# Patient Record
Sex: Female | Born: 1995 | Race: White | Hispanic: No | Marital: Married | Smoking: Never smoker
Health system: Southern US, Community
[De-identification: ages and names within clinical notes are randomized; demographics above are authoritative.]

## PROBLEM LIST (undated history)

## (undated) ENCOUNTER — Inpatient Hospital Stay (HOSPITAL_COMMUNITY): Payer: Self-pay

## (undated) DIAGNOSIS — T7840XA Allergy, unspecified, initial encounter: Secondary | ICD-10-CM

## (undated) DIAGNOSIS — R079 Chest pain, unspecified: Secondary | ICD-10-CM

## (undated) DIAGNOSIS — J40 Bronchitis, not specified as acute or chronic: Secondary | ICD-10-CM

## (undated) DIAGNOSIS — R519 Headache, unspecified: Secondary | ICD-10-CM

## (undated) DIAGNOSIS — L509 Urticaria, unspecified: Secondary | ICD-10-CM

## (undated) DIAGNOSIS — R002 Palpitations: Secondary | ICD-10-CM

## (undated) DIAGNOSIS — B999 Unspecified infectious disease: Secondary | ICD-10-CM

## (undated) DIAGNOSIS — T7806XA Anaphylactic reaction due to food additives, initial encounter: Secondary | ICD-10-CM

## (undated) DIAGNOSIS — N83209 Unspecified ovarian cyst, unspecified side: Secondary | ICD-10-CM

## (undated) DIAGNOSIS — R51 Headache: Secondary | ICD-10-CM

## (undated) DIAGNOSIS — D649 Anemia, unspecified: Secondary | ICD-10-CM

## (undated) HISTORY — PX: WISDOM TOOTH EXTRACTION: SHX21

## (undated) HISTORY — DX: Chest pain, unspecified: R07.9

## (undated) HISTORY — DX: Palpitations: R00.2

## (undated) HISTORY — DX: Urticaria, unspecified: L50.9

## (undated) HISTORY — PX: OTHER SURGICAL HISTORY: SHX169

---
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
## 2015-01-09 ENCOUNTER — Ambulatory Visit (INDEPENDENT_AMBULATORY_CARE_PROVIDER_SITE_OTHER): Payer: BLUE CROSS/BLUE SHIELD | Admitting: Emergency Medicine

## 2015-01-09 VITALS — BP 118/70 | HR 84 | Temp 97.5°F | Resp 18 | Ht 65.0 in | Wt 177.0 lb

## 2015-01-09 DIAGNOSIS — R002 Palpitations: Secondary | ICD-10-CM | POA: Diagnosis not present

## 2015-01-09 DIAGNOSIS — R21 Rash and other nonspecific skin eruption: Secondary | ICD-10-CM | POA: Diagnosis not present

## 2015-01-09 LAB — POCT CBC
GRANULOCYTE PERCENT: 70.8 % (ref 37–80)
HCT, POC: 41.6 % (ref 37.7–47.9)
HEMOGLOBIN: 13.5 g/dL (ref 12.2–16.2)
Lymph, poc: 2.2 (ref 0.6–3.4)
MCH: 27.6 pg (ref 27–31.2)
MCHC: 32.4 g/dL (ref 31.8–35.4)
MCV: 85.2 fL (ref 80–97)
MID (cbc): 0.2 (ref 0–0.9)
MPV: 7.8 fL (ref 0–99.8)
PLATELET COUNT, POC: 282 10*3/uL (ref 142–424)
POC Granulocyte: 5.7 (ref 2–6.9)
POC LYMPH PERCENT: 26.9 %L (ref 10–50)
POC MID %: 2.3 %M (ref 0–12)
RBC: 4.89 M/uL (ref 4.04–5.48)
RDW, POC: 13.6 %
WBC: 8 10*3/uL (ref 4.6–10.2)

## 2015-01-09 LAB — GLUCOSE, POCT (MANUAL RESULT ENTRY): POC GLUCOSE: 90 mg/dL (ref 70–99)

## 2015-01-09 MED ORDER — ALPRAZOLAM 0.25 MG PO TABS
ORAL_TABLET | ORAL | Status: DC
Start: 2015-01-09 — End: 2015-02-01

## 2015-01-09 MED ORDER — BETAMETHASONE DIPROPIONATE 0.05 % EX CREA: TOPICAL_CREAM | Freq: Two times a day (BID) | CUTANEOUS | Status: DC

## 2015-01-09 NOTE — Progress Notes (Signed)
Subjective:  This chart was scribed for  Jacqueline Chris MD,  by Veverly Fells, at [facility].  This patient was seen in room 5  and the patient's care was started at 1:23 PM.   Chief Complaint  Patient presents with  . Urticaria    back of both legs. x 3 days      Patient ID: [name], female    DOB: [birth year], 19 y.o.   MRN: [id number]  Urticaria Pertinent negatives include no cough or fever.    HPI Comments: [name] is a 19 y.o. female who presents to the [facility] complaining of bump like rash on the back of her legs onset four days ago.  She has allergies to bees, wasps and red 40 but states that she has not come in contact with any of these things recently.  She has not been shaving since her rash started.  She denies any recent illnesses.  She states that she has been waking up for the last month and a half with her heart pounding. When these episodes occur, she tries to take a deep breath and attempts to lower her heart rate but states that it is hard for her to do so.  She also had the fast pounding on her way over here.  She drinks caffeine but has never had it to the point where it has caused symptoms of chest pain for her.  She states that she has lost "a small amount" of weight recently. Patient states that there is no chance that she could be pregnant.  Patient is currently working and lives with her friends in [place].   Patient states that she is under a lot of stress with work, keeping up with rent and counseling that she is going through with her fiance (hopes to get married in the summer).  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     There are no active problems to display for this patient.  Past Medical History  Diagnosis Date  . Allergy   . 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 Bronchitis     Fall 2013,  prescribed inhaler, does not take inhaler now.   Past Surgical History  Procedure Laterality Date  . Other surgical history      splinter R foot req surgery, L foot glass required surgery, dates unknown  . Laparoscopic appendectomy N/A 05/27/2012    Procedure: APPENDECTOMY LAPAROSCOPIC;  Surgeon: Judie Petit. Leonia Corona, MD;  Location: [facility] OR;  Service: Pediatrics;  Laterality: N/A;   Allergies  Allergen Reactions  . Bee Venom Anaphylaxis  . Red Dye Anaphylaxis  . Wasp Venom Anaphylaxis  . Latex    Prior to Admission medications   Medication Sig Start Date End Date Taking? Authorizing Provider  EPINEPHrine (EPIPEN JR) 0.15 MG/0.3ML injection Inject 0.15 mg into the muscle daily as needed for anaphylaxis.   Yes Historical Provider, MD  EPINEPHrine (EPIPEN) 0.3 mg/0.3 mL DEVI tud 08/11/12  Yes Viviano Simas, NP  predniSONE (DELTASONE) 50 MG tablet 1 tab po qd x 4 more days Patient not taking: Reported on 01/09/2015 08/11/12   Viviano Simas, NP   Social History   Social History  . Marital Status: Single    Spouse Name: N/A  . Number of Children: N/A  . Years of Education: N/A   Occupational  History  . Not on file.   Social History Main Topics  . Smoking status: Never Smoker   . Smokeless tobacco: Not on file  . Alcohol Use: No  . Drug Use: No  . Sexual Activity: No   Other Topics Concern  . Not on file   Social History Narrative      Review of Systems  Constitutional: Negative for fever and chills.  Respiratory: Negative for cough and choking.   Cardiovascular: Positive for palpitations. Negative for chest pain.  Gastrointestinal: Negative for nausea.  Skin: Positive for rash.       Objective:   Physical Exam  Filed Vitals:   01/09/15 1223  BP: 118/70  Pulse: 84  Temp: 97.5 F (36.4 C)  TempSrc: Oral  Resp: 18  Height:  (1.651 m)  Weight: 177 lb (80.287 kg)  SpO2: 99%   CONSTITUTIONAL: Well developed/well nourished HEAD:  Normocephalic/atraumatic EYES: EOMI/PERRL ENMT: Mucous membranes moist NECK: supple no meningeal signs SPINE/BACK:entire spine nontender CV: S1/S2 noted, no murmurs/rubs/gallops noted LUNGS: Lungs are clear to auscultation bilaterally, no apparent distress ABDOMEN: soft, nontender, no rebound or guarding, bowel sounds noted throughout abdomen GU:no cva tenderness NEURO: Pt is awake/alert/appropriate, moves all extremitiesx4.  No facial droop.   EXTREMITIES: pulses normal/equal, full ROM SKIN: warm, color normal, she has multiple tiny red bumps on the back of both legs.   PSYCH: no abnormalities of mood noted, alert and oriented to situation EKG shows an unusual axis to the right otherwise no abnormalities.    Assessment & Plan:  The area on the back of her legs looks more like a heat rash. She will use a cortisone cream to this area. Advised her to put sheets over her cell phone and chairs. She also has a history of palpitations. She is under quite a bit of stress with her upcoming wedding as well as work stress and financial issues. Baseline EKG shows mild abnormalities. Referral made to cardiology for their opinion.I personally performed the services described in this documentation, which was scribed in my presence. The recorded information has been reviewed and is accurate.

## 2015-01-09 NOTE — Patient Instructions (Signed)
Heat Rash Heat rash (miliaria) is a skin irritation caused by heavy sweating during hot, humid weather. It results from blockage of the sweat glands on our body. It can occur at any age. It is most common in young children whose sweat ducts are still developing or are not fully developed. Tight clothing may make the condition worse. Heat rash can look like small blisters (vesicles) that break open easily with bathing or minimal pressure. These blisters are found most commonly on the face, upper trunk of children and the trunk of adults. It can also look like a red cluster of red bumps or pimples (pustules). These usually itch and can also sometimes burn. It is more likely to occur on the neck and upper chest, in the groin, under the breasts, and in elbow creases. HOME CARE INSTRUCTIONS   The best treatment for heat rash is to provide a cooler, less humid environment where sweating is much decreased.  Keep the affected area dry. Dusting powder (cornstarch powder, baby powder) may be used to increase comfort. Avoid using ointments or creams. They keep the skin warm and moist and may make the condition worse.  Treating heat rash is simple and usually does not require medical assistance. SEEK MEDICAL CARE IF:   There is any evidence of infection such as fever, redness, swelling.  There is discomfort such as pain.  The skin lesions do no resolve with cooler, dryer environment. MAKE SURE YOU:   Understand these instructions.  Will watch your condition.  Will get help right away if you are not doing well or get worse. Document Released: 03/25/2009 Document Revised: 06/29/2011 Document Reviewed: 03/25/2009 [facility] Patient Information 2015 [place], [place]. This information is not intended to replace advice given to you by your health care provider. Make sure you discuss any questions you have with your health care provider. Palpitations A palpitation is the feeling that your heartbeat is irregular  or is faster than normal. It may feel like your heart is fluttering or skipping a beat. Palpitations are usually not a serious problem. However, in some cases, you may need further medical evaluation. CAUSES  Palpitations can be caused by:  Smoking.  Caffeine or other stimulants, such as diet pills or energy drinks.  Alcohol.  Stress and anxiety.  Strenuous physical activity.  Fatigue.  Certain medicines.  Heart disease, especially if you have a history of irregular heart rhythms (arrhythmias), such as atrial fibrillation, atrial flutter, or supraventricular tachycardia.  An improperly working pacemaker or defibrillator. DIAGNOSIS  To find the cause of your palpitations, your health care provider will take your medical history and perform a physical exam. Your health care provider may also have you take a test called an ambulatory electrocardiogram (ECG). An ECG records your heartbeat patterns over a 24-hour period. You may also have other tests, such as:  Transthoracic echocardiogram (TTE). During echocardiography, sound waves are used to evaluate how blood flows through your heart.  Transesophageal echocardiogram (TEE).  Cardiac monitoring. This allows your health care provider to monitor your heart rate and rhythm in real time.  Holter monitor. This is a portable device that records your heartbeat and can help diagnose heart arrhythmias. It allows your health care provider to track your heart activity for several days, if needed.  Stress tests by exercise or by giving medicine that makes the heart beat faster. TREATMENT  Treatment of palpitations depends on the cause of your symptoms and can vary greatly. Most cases of palpitations do not require  any treatment other than time, relaxation, and monitoring your symptoms. Other causes, such as atrial fibrillation, atrial flutter, or supraventricular tachycardia, usually require further treatment. HOME CARE INSTRUCTIONS    Avoid:  Caffeinated coffee, tea, soft drinks, diet pills, and energy drinks.  Chocolate.  Alcohol.  Stop smoking if you smoke.  Reduce your stress and anxiety. Things that can help you relax include:  A method of controlling things in your body, such as your heartbeats, with your mind (biofeedback).  Yoga.  Meditation.  Physical activity such as swimming, jogging, or walking.  Get plenty of rest and sleep. SEEK MEDICAL CARE IF:   You continue to have a fast or irregular heartbeat beyond 24 hours.  Your palpitations occur more often. SEEK IMMEDIATE MEDICAL CARE IF:  You have chest pain or shortness of breath.  You have a severe headache.  You feel dizzy or you faint. MAKE SURE YOU:  Understand these instructions.  Will watch your condition.  Will get help right away if you are not doing well or get worse. Document Released: 04/03/2000 Document Revised: 04/11/2013 Document Reviewed: 06/05/2011 [facility] Patient Information 2015 [place], [place]. This information is not intended to replace advice given to you by your health care provider. Make sure you discuss any questions you have with your health care provider.

## 2015-01-09 NOTE — Progress Notes (Signed)
Subjective:  This chart was scribed for  [name],  by Veverly Fells, at [facility].  This patient was seen in room 5  and the patient's care was started at 1:23 PM.   Chief Complaint  Patient presents with   Urticaria    back of both legs. x 3 days      Patient ID: [name], female    DOB: [birth year], 19 y.o.   MRN: [id number]  HPI  HPI Comments: [name] is a 19 y.o. female who presents to the [facility] complaining of bump like rash on the back of her legs onset four days ago.  She has allergies to bees, wasps and red 40 but states that she has not come in contact with any of these things recently.  She has not been shaving since her rash started.  She denies any recent illnesses.  She states that she has been waking up for the last month and a half with her heart pounding. When these episodes occur, she tries to take a deep breath and attempts to lower her heart rate but states that it is hard for her to do so.  She also had the fast pounding on her way over here.  She drinks caffeine but has never had it to the point where it has caused symptoms of chest pain for her.  She states that she has lost "a small amount" of weight recently. Patient states that there is no chance that she could be pregnant.  Patient is currently working and lives with her friends in [place].   Patient states that she is under a lot of stress with work, keeping up with rent and counseling that she is going through with her fiance (hopes to get married in the summer).  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     There are no active problems to display for this patient.  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   Past  Surgical History  Procedure Laterality Date   Other surgical history      splinter R foot req surgery, L foot glass required surgery, dates unknown   Laparoscopic appendectomy N/A 05/27/2012    Procedure: APPENDECTOMY LAPAROSCOPIC;  Surgeon: Judie Petit. Leonia Corona, MD;  Location: [facility] OR;  Service: Pediatrics;  Laterality: N/A;   Allergies  Allergen Reactions   Bee Venom Anaphylaxis   Red Dye Anaphylaxis   Wasp Venom Anaphylaxis   Latex    Prior to Admission medications   Medication Sig Start Date End Date Taking? Authorizing Provider  EPINEPHrine (EPIPEN JR) 0.15 MG/0.3ML injection Inject 0.15 mg into the muscle daily as needed for anaphylaxis.   Yes Historical Provider, MD  EPINEPHrine (EPIPEN) 0.3 mg/0.3 mL DEVI tud 08/11/12  Yes Viviano Simas, NP  predniSONE (DELTASONE) 50 MG tablet 1 tab po qd x 4 more days Patient not taking: Reported on 01/09/2015 08/11/12   Viviano Simas, NP   Social History   Social History   Marital Status: Single    Spouse Name: N/A   Number of Children: N/A   Years of Education: N/A   Occupational History   Not on file.   Social  History Main Topics   Smoking status: Never Smoker    Smokeless tobacco: Not on file   Alcohol Use: No   Drug Use: No   Sexual Activity: No   Other Topics Concern   Not on file   Social History Narrative      Review of Systems  Constitutional: Negative for fever and chills.  Respiratory: Negative for cough and choking.   Cardiovascular: Positive for palpitations. Negative for chest pain.  Gastrointestinal: Negative for nausea.  Skin: Positive for rash.       Objective:   Physical Exam  Filed Vitals:   01/09/15 1223  BP: 118/70  Pulse: 84  Temp: 97.5 F (36.4 C)  TempSrc: Oral  Resp: 18  Height:  (1.651 m)  Weight: 177 lb (80.287 kg)  SpO2: 99%   CONSTITUTIONAL: Well developed/well nourished HEAD: Normocephalic/atraumatic EYES: EOMI/PERRL ENMT: Mucous membranes moist NECK:  supple no meningeal signs SPINE/BACK:entire spine nontender CV: S1/S2 noted, no murmurs/rubs/gallops noted LUNGS: Lungs are clear to auscultation bilaterally, no apparent distress ABDOMEN: soft, nontender, no rebound or guarding, bowel sounds noted throughout abdomen GU:no cva tenderness NEURO: Pt is awake/alert/appropriate, moves all extremitiesx4.  No facial droop.   EXTREMITIES: pulses normal/equal, full ROM SKIN: warm, color normal, she has multiple tiny red bumps on the back of both legs.   PSYCH: no abnormalities of mood noted, alert and oriented to situation EKG shows an unusual axis to the right otherwise no abnormalities.    Assessment & Plan:  The area on the back of her legs looks more like a heat rash. She will use a cortisone cream to this area. Advised her to put sheets over her cell phone and chairs. She also has a history of palpitations. She is under quite a bit of stress with her upcoming wedding as well as work stress and financial issues. Baseline EKG shows mild abnormalities. Referral made to cardiology for their opinion.I personally performed the services described in this documentation, which was scribed in my presence. The recorded information has been reviewed and is accurate.

## 2015-01-10 LAB — TSH: TSH: 0.918 u[IU]/mL (ref 0.350–4.500)

## 2015-01-31 NOTE — Progress Notes (Signed)
Cardiology Office Note   Date:  02/01/2015   ID:  [name], DOB 04/21/1995, MRN [id number]  PCP:  No PCP Per Patient  Cardiologist:   Madilyn Hook, MD   Chief Complaint  Patient presents with  . New Evaluation    pt states chest pain, Tuesday had sharp pain in chest that lasted 10 min//happens every once in a while, Tuesday was the worst.  . Shortness of Breath    when she gives tight hugs, she can't breath very well/stopped running because she couldn't breth and chest would start hurting  . Dizziness    when changing positions      History of Present Illness: [name] is a 19 y.o. female who presents for an evaluation of palpitations.  Ms. [name] reports a couple months of waking up with her heart racing.  The episodes last for several minutes and it is hard for her to fall back asleep.  Approximately one month ago she noticed chest pain while running.  It lasted for 15-20 minutes and occurred after she had been running for 2 miles. She felt short of breath and nauseous. The pain was 8 out of 10 in severity and did not radiate. Since then she has not been running because she is afraid this will happen again.  She has exerted herself minimally with walking and lifting boxes and has not had any recurrent chest pain since that time. She also felt as though her heart beating fast at the time but is unsure whether this was due to palpitations ordered because she had been running. Since then she continues to have daily episodes of heart racing that last for several minutes. It's hard for her to catch her breath when it happens. At times it wakes her from sleeping.    Ms. [name] saw her PCP on September 21 and reported these symptoms.  Dr. Lesle Chris ordered thyroid testing which was normal. He also checked a blood count which was negative for anemia. Her EKG was unremarkable.  He recommended that she follow up with cardiology to further evaluate these symptoms.  He also  recommended that she stop drinking caffeine, which has not helped.  Ms. [name] is unsure of her family history.  She thinks someone in her family has hypertension.  Her great grandfather had several heart attacks.  She thinks the first was at age 53.       Past Medical History  Diagnosis Date  . Allergy   . 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 Bronchitis     Fall 2013, prescribed inhaler, does not take inhaler now.  . Palpitations   . Chest pain     Past Surgical History  Procedure Laterality Date  . Other surgical history      splinter R foot req surgery, L foot glass required surgery, dates unknown  . Laparoscopic appendectomy N/A 05/27/2012    Procedure: APPENDECTOMY LAPAROSCOPIC;  Surgeon: Judie Petit. Leonia Corona, MD;  Location: [facility] OR;  Service: Pediatrics;  Laterality: N/A;     Current Outpatient Prescriptions  Medication Sig Dispense Refill  . EPINEPHrine (EPIPEN) 0.3 mg/0.3 mL DEVI tud 1 Device 2   No current facility-administered medications for this visit.    Allergies:   Bee venom; Red dye; Wasp venom; and Latex    Social History:  The patient  reports that she has never smoked. She does not have any smokeless  tobacco history on file. She reports that she does not drink alcohol or use illicit drugs.   Family History:  The patient's family history includes Alcohol abuse in her mother; Appendicitis in her brother; Cancer in her maternal aunt, paternal grandmother, and paternal uncle; Diabetes in her maternal grandfather, maternal grandmother, and mother; Drug abuse in her brother; Hypertension in her maternal grandfather and mother.    ROS:  Please see the history of present illness.   Otherwise, review of systems are positive for none.   All other systems are reviewed and negative.    PHYSICAL EXAM: VS:  BP   Pulse 63  Ht 5' 5.5" (1.664 m)  Wt 80.604 kg (177 lb 11.2 oz)  BMI 29.11 kg/m2  LMP 01/04/2015 , BMI Body mass  index is 29.11 kg/(m^2). GENERAL:  Well appearing HEENT:  Pupils equal round and reactive, fundi not visualized, oral mucosa unremarkable NECK:  No jugular venous distention, waveform within normal limits, carotid upstroke brisk and symmetric, no bruits, no thyromegaly LYMPHATICS:  No cervical adenopathy LUNGS:  Clear to auscultation bilaterally HEART:  RRR.  PMI not displaced or sustained,S1 and S2 within normal limits, no S3, no S4, no clicks, no rubs, no murmurs ABD:  Flat, positive bowel sounds normal in frequency in pitch, no bruits, no rebound, no guarding, no midline pulsatile mass, no hepatomegaly, no splenomegaly EXT:  2 plus pulses throughout, no edema, no cyanosis no clubbing SKIN:  No rashes no nodules NEURO:  Cranial nerves II through XII grossly intact, motor grossly intact throughout PSYCH:  Cognitively intact, oriented to person place and time    EKG:  EKG is ordered today. The ekg ordered today demonstrates sinus rhythm with R axis deviation.  Early R wave progression.  ?RVH.   Recent Labs: 01/09/2015: Hemoglobin 13.5; TSH 0.918    Lipid Panel No results found for: CHOL, TRIG, HDL, CHOLHDL, VLDL, LDLCALC, LDLDIRECT    Wt Readings from Last 3 Encounters:  02/01/15 80.604 kg (177 lb 11.2 oz)  01/09/15 80.287 kg (177 lb)  05/27/12 72.9 kg (160 lb 11.5 oz) (91 %*, Z = 1.32)   * Growth percentiles are based on CDC 2-20 Years data.      ASSESSMENT AND PLAN:  # Chest pain: Symptoms are atypical and she is quite young for obstructive coronary disease.  However, she is not exercising due to this pain.  Will obtain a treadmill stress test to rule out obstructive coronary disease.  We will also check a lipid panel to assess her baseline risk of coronary disease.  # Palpitations: ECG does not show any rhythm abnormalities.  We will obtain a 48 hour Holter, as she reports symptoms almost daily. She already had her thyroid and blood counts checked. We will check a basic  metabolic panel. We recommended that she continue to limit her caffeine intake.   Current medicines are reviewed at length with the patient today.  The patient does not have concerns regarding medicines.  The following changes have been made:  no change  Labs/ tests ordered today include:   Orders Placed This Encounter  Procedures  . Lipid panel  . Basic metabolic panel  . Holter monitor - 48 hour  . Exercise Tolerance Test  . EKG 12-Lead     Disposition:   FU with Sani Loiseau C. Duke Salvia, MD in 1 month.   Signed, Madilyn Hook, MD  02/01/2015 9:14 AM    [facility]

## 2015-02-01 ENCOUNTER — Ambulatory Visit (INDEPENDENT_AMBULATORY_CARE_PROVIDER_SITE_OTHER): Payer: BLUE CROSS/BLUE SHIELD | Admitting: Cardiovascular Disease

## 2015-02-01 ENCOUNTER — Encounter: Payer: Self-pay | Admitting: Cardiovascular Disease

## 2015-02-01 ENCOUNTER — Telehealth (HOSPITAL_COMMUNITY): Payer: Self-pay

## 2015-02-01 ENCOUNTER — Encounter (INDEPENDENT_AMBULATORY_CARE_PROVIDER_SITE_OTHER): Payer: BLUE CROSS/BLUE SHIELD

## 2015-02-01 VITALS — HR 63 | Ht 65.5 in | Wt 177.7 lb

## 2015-02-01 DIAGNOSIS — R079 Chest pain, unspecified: Secondary | ICD-10-CM | POA: Diagnosis not present

## 2015-02-01 DIAGNOSIS — R002 Palpitations: Secondary | ICD-10-CM

## 2015-02-01 DIAGNOSIS — Z13220 Encounter for screening for lipoid disorders: Secondary | ICD-10-CM | POA: Diagnosis not present

## 2015-02-01 LAB — LIPID PANEL
CHOL/HDL RATIO: 2.9 ratio (ref <=5.0)
CHOLESTEROL: 143 mg/dL (ref 125–170)
HDL: 50 mg/dL (ref >=46)
LDL Cholesterol: 80 mg/dL (ref <110)
TRIGLYCERIDES: 65 mg/dL (ref <150)
VLDL: 13 mg/dL (ref <30)

## 2015-02-01 LAB — BASIC METABOLIC PANEL
BUN: 13 mg/dL (ref 7–25)
CALCIUM: 9.4 mg/dL (ref 8.6–10.2)
CO2: 26 mmol/L (ref 20–31)
CREATININE: 0.63 mg/dL (ref 0.50–1.10)
Chloride: 105 mmol/L (ref 98–110)
GLUCOSE: 88 mg/dL (ref 65–99)
Potassium: 4.3 mmol/L (ref 3.5–5.3)
Sodium: 139 mmol/L (ref 135–146)

## 2015-02-01 NOTE — Telephone Encounter (Signed)
Encounter complete. 

## 2015-02-01 NOTE — Patient Instructions (Signed)
Labs- LIPID, BMP  Your physician has requested that you have an exercise tolerance test. For further information please visit [email or web address]. Please also follow instruction sheet, as given.  Your physician has recommended that you wear a holter monitor FOR 48 HOURS . Holter monitors are medical devices that record the heart's electrical activity. Doctors most often use these monitors to diagnose arrhythmias. Arrhythmias are problems with the speed or rhythm of the heartbeat. The monitor is a small, portable device. You can wear one while you do your normal daily activities. This is usually used to diagnose what is causing palpitations/syncope (passing out).  WILL CALL WITH RESULTS  Your physician recommends that you schedule a follow-up appointment in 1 MONTH WITH DR .

## 2015-02-05 ENCOUNTER — Encounter (HOSPITAL_COMMUNITY): Payer: Self-pay | Admitting: *Deleted

## 2015-02-05 ENCOUNTER — Ambulatory Visit (HOSPITAL_COMMUNITY)
Admission: RE | Admit: 2015-02-05 | Discharge: 2015-02-05 | Disposition: A | Discharge status: Home / Self Care | Payer: BLUE CROSS/BLUE SHIELD | Source: Ambulatory Visit | Attending: Cardiovascular Disease | Admitting: Cardiovascular Disease

## 2015-02-05 DIAGNOSIS — Z13220 Encounter for screening for lipoid disorders: Secondary | ICD-10-CM | POA: Insufficient documentation

## 2015-02-05 DIAGNOSIS — R072 Precordial pain: Secondary | ICD-10-CM | POA: Diagnosis not present

## 2015-02-05 DIAGNOSIS — R002 Palpitations: Secondary | ICD-10-CM | POA: Diagnosis not present

## 2015-02-05 DIAGNOSIS — R079 Chest pain, unspecified: Secondary | ICD-10-CM | POA: Insufficient documentation

## 2015-02-05 LAB — EXERCISE TOLERANCE TEST
CHL RATE OF PERCEIVED EXERTION: 17
CSEPED: 10 min
CSEPEDS: 38 s
CSEPEW: 12.7 METS
CSEPHR: 96 %
CSEPPHR: 193 {beats}/min
MPHR: 200 {beats}/min
Rest HR: 75 {beats}/min

## 2015-02-05 NOTE — Progress Notes (Unsigned)
Patient ID: Jacqueline CambridgeStephanie Carroll, female   DOB: 09-04-1995, 19 y.o.   MRN: [id number]  Patient experienced chest pain and dizziness. Test taken to Dr. Allyson SabalBerry and was okayed to D/C patient home when symptoms completely resolved.

## 2015-02-06 ENCOUNTER — Telehealth: Payer: Self-pay | Admitting: *Deleted

## 2015-02-06 NOTE — Telephone Encounter (Signed)
Spoke to patient.labs, stress test  Result given . Verbalized understanding

## 2015-02-06 NOTE — Telephone Encounter (Signed)
-----   Message from Chilton Siiffany Wentworth, MD sent at 02/05/2015  5:26 PM EDT ----- Normal lipids, electrolytes and kidney function.

## 2015-02-06 NOTE — Telephone Encounter (Signed)
-----   Message from Chilton Siiffany Pyatt, MD sent at 02/06/2015  2:02 PM EDT ----- Normal stress test.

## 2015-02-13 ENCOUNTER — Telehealth: Payer: Self-pay | Admitting: *Deleted

## 2015-02-13 NOTE — Telephone Encounter (Signed)
-----   Message from Chilton Siiffany Milo, MD sent at 02/12/2015  5:18 PM EDT ----- Normal study. We will review this at her follow up appointment.

## 2015-02-13 NOTE — Telephone Encounter (Signed)
Not able to leave message mailbox full

## 2015-02-15 ENCOUNTER — Telehealth: Payer: Self-pay | Admitting: Cardiovascular Disease

## 2015-02-15 NOTE — Telephone Encounter (Signed)
Mail box full- unable to leave message   result will be given at next appointment 03/04/15

## 2015-02-15 NOTE — Telephone Encounter (Signed)
Spoke to patient. Result given . Verbalized understanding  

## 2015-02-15 NOTE — Telephone Encounter (Signed)
Pt is returning your call

## 2015-03-03 NOTE — Progress Notes (Signed)
e  This encounter was created in error - please disregard.

## 2015-03-04 ENCOUNTER — Encounter: Payer: BLUE CROSS/BLUE SHIELD | Admitting: Cardiovascular Disease

## 2015-03-04 DIAGNOSIS — R0989 Other specified symptoms and signs involving the circulatory and respiratory systems: Secondary | ICD-10-CM

## 2015-12-05 ENCOUNTER — Emergency Department (HOSPITAL_COMMUNITY)
Admission: EM | Admit: 2015-12-05 | Discharge: 2015-12-05 | Disposition: A | Discharge status: Home / Self Care | Payer: BLUE CROSS/BLUE SHIELD | Attending: Emergency Medicine | Admitting: Emergency Medicine

## 2015-12-05 ENCOUNTER — Encounter (HOSPITAL_COMMUNITY): Payer: Self-pay | Admitting: Emergency Medicine

## 2015-12-05 ENCOUNTER — Emergency Department (HOSPITAL_COMMUNITY): Payer: BLUE CROSS/BLUE SHIELD

## 2015-12-05 DIAGNOSIS — N83201 Unspecified ovarian cyst, right side: Secondary | ICD-10-CM | POA: Insufficient documentation

## 2015-12-05 DIAGNOSIS — R1030 Lower abdominal pain, unspecified: Secondary | ICD-10-CM

## 2015-12-05 DIAGNOSIS — R1031 Right lower quadrant pain: Secondary | ICD-10-CM | POA: Diagnosis present

## 2015-12-05 DIAGNOSIS — Z91040 Latex allergy status: Secondary | ICD-10-CM | POA: Diagnosis not present

## 2015-12-05 DIAGNOSIS — Z79899 Other long term (current) drug therapy: Secondary | ICD-10-CM | POA: Insufficient documentation

## 2015-12-05 DIAGNOSIS — R102 Pelvic and perineal pain: Secondary | ICD-10-CM

## 2015-12-05 LAB — URINALYSIS, ROUTINE W REFLEX MICROSCOPIC
Bilirubin Urine: NEGATIVE
Glucose, UA: NEGATIVE mg/dL
Hgb urine dipstick: NEGATIVE
KETONES UR: NEGATIVE mg/dL
LEUKOCYTES UA: NEGATIVE
NITRITE: NEGATIVE
PROTEIN: NEGATIVE mg/dL
Specific Gravity, Urine: 1.01 (ref 1.005–1.030)
pH: 6.5 (ref 5.0–8.0)

## 2015-12-05 LAB — I-STAT BETA HCG BLOOD, ED (MC, WL, AP ONLY)

## 2015-12-05 LAB — CBC
HCT: 39.9 % (ref 36.0–46.0)
Hemoglobin: 14 g/dL (ref 12.0–15.0)
MCH: 29.7 pg (ref 26.0–34.0)
MCHC: 35.1 g/dL (ref 30.0–36.0)
MCV: 84.5 fL (ref 78.0–100.0)
PLATELETS: 217 10*3/uL (ref 150–400)
RBC: 4.72 MIL/uL (ref 3.87–5.11)
RDW: 12.5 % (ref 11.5–15.5)
WBC: 7.8 10*3/uL (ref 4.0–10.5)

## 2015-12-05 LAB — COMPREHENSIVE METABOLIC PANEL
ALT: 14 U/L (ref 14–54)
AST: 17 U/L (ref 15–41)
Albumin: 5 g/dL (ref 3.5–5.0)
Alkaline Phosphatase: 54 U/L (ref 38–126)
Anion gap: 8 (ref 5–15)
BILIRUBIN TOTAL: 0.5 mg/dL (ref 0.3–1.2)
BUN: 17 mg/dL (ref 6–20)
CHLORIDE: 108 mmol/L (ref 101–111)
CO2: 22 mmol/L (ref 22–32)
CREATININE: 0.59 mg/dL (ref 0.44–1.00)
Calcium: 9.6 mg/dL (ref 8.9–10.3)
Glucose, Bld: 91 mg/dL (ref 65–99)
POTASSIUM: 3.8 mmol/L (ref 3.5–5.1)
Sodium: 138 mmol/L (ref 135–145)
TOTAL PROTEIN: 7.5 g/dL (ref 6.5–8.1)

## 2015-12-05 LAB — LIPASE, BLOOD: LIPASE: 22 U/L (ref 11–51)

## 2015-12-05 MED ORDER — ONDANSETRON HCL 4 MG/2ML IJ SOLN
4.0000 mg | Freq: Once | INTRAMUSCULAR | Status: AC | PRN
  Administered 2015-12-05: 4 mg via INTRAVENOUS
  Filled 2015-12-05: qty 2

## 2015-12-05 MED ORDER — FENTANYL CITRATE (PF) 100 MCG/2ML IJ SOLN
50.0000 ug | INTRAMUSCULAR | Status: DC | PRN
  Administered 2015-12-05: 50 ug via INTRAVENOUS
  Filled 2015-12-05: qty 2

## 2015-12-05 MED ORDER — IBUPROFEN 800 MG PO TABS
800.0000 mg | ORAL_TABLET | Freq: Three times a day (TID) | ORAL | 0 refills | Status: DC | PRN
Start: 2015-12-05 — End: 2016-02-04

## 2015-12-05 NOTE — Discharge Instructions (Signed)
Read the information below.  Use the prescribed medication as directed.  Please discuss all new medications with your pharmacist.  You may return to the Emergency Department at any time for worsening condition or any new symptoms that concern you.   If you develop high fevers, worsening abdominal pain, uncontrolled vomiting, or are unable to tolerate fluids by mouth, return to the ER for a recheck.  ° °

## 2015-12-05 NOTE — ED Triage Notes (Signed)
Pt states she woke up about 45 minutes with severe lower abd pain  Pt states the pain is in the middle of her lower abdomen area and a little to the right side  Pt states she thought it was menstrual cramps but these are much worse  Pt has nausea without vomiting

## 2015-12-05 NOTE — ED Provider Notes (Signed)
[facility]-EMERGENCY DEPT Provider Note   CSN: [id number] Arrival date & time: 12/05/15  0306     History   Chief Complaint Chief Complaint  Patient presents with  . Abdominal Pain    HPI [name] is a 20 y.o. female.  HPI   Patient presents with sharp lower abdominal pain that woke her from sleep.  Associated nausea.  Described as constant but increases in waves, located in suprapubic area and to the right, compared to more severe menstrual cramps.  LMP was two weeks ago and she ovulated 3 days ago.  Has urinary frequency for some time.  Denies fever, vomiting, dysuria, change in bowel habits.  Denies any abnormal vaginal discharge or bleeding.  Has had increased anxiety recently and notes a hx PTSD.  She is monogamous with new husband of 3 months.  s/p appendectomy, no other abdominal surgeries.  Never had known ovarian cyst.    Past Medical History:  Diagnosis Date  . Allergy   . 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 Bronchitis    Fall 2013, prescribed inhaler, does not take inhaler now.  . Chest pain   . Palpitations     Patient Active Problem List   Diagnosis Date Noted  . Palpitations   . Chest pain     Past Surgical History:  Procedure Laterality Date  . LAPAROSCOPIC APPENDECTOMY N/A 05/27/2012   Procedure: APPENDECTOMY LAPAROSCOPIC;  Surgeon: Judie PetitM. Leonia CoronaShuaib Farooqui, MD;  Location: [facility] OR;  Service: Pediatrics;  Laterality: N/A;  . OTHER SURGICAL HISTORY     splinter R foot req surgery, L foot glass required surgery, dates unknown    OB History    No data available       Home Medications    Prior to Admission medications   Medication Sig Start Date End Date Taking? Authorizing Provider  EPINEPHrine (EPIPEN) 0.3 mg/0.3 mL DEVI tud 08/11/12  Yes Viviano SimasLauren Robinson, NP  Multiple Vitamin (MULTIVITAMIN WITH MINERALS) TABS tablet Take 1 tablet by mouth daily.   Yes Historical Provider, MD  ibuprofen (ADVIL,MOTRIN) 800 MG  tablet Take 1 tablet (800 mg total) by mouth every 8 (eight) hours as needed for mild pain or moderate pain. 12/05/15   Trixie DredgeEmily Precious Gilchrest, PA-C    Family History Family History  Problem Relation Age of Onset  . Diabetes Mother   . Hypertension Mother   . Alcohol abuse Mother   . Appendicitis Brother   . Drug abuse Brother   . Cancer Maternal Aunt   . Cancer Paternal Uncle   . Diabetes Maternal Grandmother   . Diabetes Maternal Grandfather   . Hypertension Maternal Grandfather   . Cancer Paternal Grandmother     liver cancer    Social History Social History  Substance Use Topics  . Smoking status: Never Smoker  . Smokeless tobacco: Never Used  . Alcohol use No     Allergies   Bee venom; Red dye; Wasp venom; and Latex   Review of Systems Review of Systems  All other systems reviewed and are negative.    Physical Exam Updated Vital Signs BP (!) 93/54 (BP Location: Right Arm)   Pulse 61   Temp 98.1 F (36.7 C) (Oral)   Resp 18   Ht 5' 5.5" (1.664 m)   LMP 11/17/2015 (Approximate)   SpO2 99%   Physical Exam  Constitutional: She appears well-developed and well-nourished. No distress.  HENT:  Head: Normocephalic and  atraumatic.  Neck: Neck supple.  Cardiovascular: Normal rate and regular rhythm.   Pulmonary/Chest: Effort normal and breath sounds normal. No respiratory distress. She has no wheezes. She has no rales.  Abdominal: Soft. Bowel sounds are normal. She exhibits no distension. There is tenderness. There is no rebound, no guarding and no CVA tenderness.  Diffuse tenderness, worst in suprapubic region.    Neurological: She is alert.  Skin: She is not diaphoretic.  Nursing note and vitals reviewed.    ED Treatments / Results  Labs (all labs ordered are listed, but only abnormal results are displayed) Labs Reviewed  LIPASE, BLOOD  COMPREHENSIVE METABOLIC PANEL  CBC  URINALYSIS, ROUTINE W REFLEX MICROSCOPIC (NOT AT [facility])  I-STAT BETA HCG BLOOD, ED ([facility], [facility],  [facility] ONLY)    EKG  EKG Interpretation None       Radiology US Transvaginal Non-ob  Result Date: 12/05/2015 CLINICAL DATA:  Pelvic pain EXAM: TRANSABDOMINAL AND TRANSVAGINAL ULTRASOUND OF PELVIS DOPPLER ULTRASOUND OF OVARIES TECHNIQUE: Both transabdominal and transvaginal ultrasound examinations of the pelvis were performed. Transabdominal technique was performed for global imaging of the pelvis including uterus, ovaries, adnexal regions, and pelvic cul-de-sac. It was necessary to proceed with endovaginal exam following the transabdominal exam to visualize the uterus and ovaries. Color and duplex Doppler ultrasound was utilized to evaluate blood flow to the ovaries. COMPARISON:  Pelvic ultrasound 05/27/2012 FINDINGS: Uterus Measurements: 7.2 x 3.1 x 4.6 cm. No fibroids or other mass visualized. Endometrium Thickness: 12.4 mm.  No focal abnormality visualized. Right ovary Measurements: 3.8 x 2.7 x 3.5 cm. Multiple follicles. Hypoechoic region in the right ovary likely a complex cyst measuring 3 cm. Normal appearance/no adnexal mass. Left ovary Measurements: 3.8 x 1.3 x 3.4 cm. Normal appearance/no adnexal mass. Pulsed Doppler evaluation of both ovaries demonstrates normal low-resistance arterial and venous waveforms. Other findings Small amount of free fluid. IMPRESSION: No acute abnormality in the pelvis. Electronically Signed   By: Marlan Palau M.D.   On: 12/05/2015 07:56   US Pelvis Complete  Result Date: 12/05/2015 CLINICAL DATA:  Pelvic pain EXAM: TRANSABDOMINAL AND TRANSVAGINAL ULTRASOUND OF PELVIS DOPPLER ULTRASOUND OF OVARIES TECHNIQUE: Both transabdominal and transvaginal ultrasound examinations of the pelvis were performed. Transabdominal technique was performed for global imaging of the pelvis including uterus, ovaries, adnexal regions, and pelvic cul-de-sac. It was necessary to proceed with endovaginal exam following the transabdominal exam to visualize the uterus and ovaries. Color and  duplex Doppler ultrasound was utilized to evaluate blood flow to the ovaries. COMPARISON:  Pelvic ultrasound 05/27/2012 FINDINGS: Uterus Measurements: 7.2 x 3.1 x 4.6 cm. No fibroids or other mass visualized. Endometrium Thickness: 12.4 mm.  No focal abnormality visualized. Right ovary Measurements: 3.8 x 2.7 x 3.5 cm. Multiple follicles. Hypoechoic region in the right ovary likely a complex cyst measuring 3 cm. Normal appearance/no adnexal mass. Left ovary Measurements: 3.8 x 1.3 x 3.4 cm. Normal appearance/no adnexal mass. Pulsed Doppler evaluation of both ovaries demonstrates normal low-resistance arterial and venous waveforms. Other findings Small amount of free fluid. IMPRESSION: No acute abnormality in the pelvis. Electronically Signed   By: Marlan Palau M.D.   On: 12/05/2015 07:56   [place] Art/ven Flow Abd Pelv Doppler  Result Date: 12/05/2015 CLINICAL DATA:  Pelvic pain EXAM: TRANSABDOMINAL AND TRANSVAGINAL ULTRASOUND OF PELVIS DOPPLER ULTRASOUND OF OVARIES TECHNIQUE: Both transabdominal and transvaginal ultrasound examinations of the pelvis were performed. Transabdominal technique was performed for global imaging of the pelvis including uterus, ovaries, adnexal regions, and pelvic  cul-de-sac. It was necessary to proceed with endovaginal exam following the transabdominal exam to visualize the uterus and ovaries. Color and duplex Doppler ultrasound was utilized to evaluate blood flow to the ovaries. COMPARISON:  Pelvic ultrasound 05/27/2012 FINDINGS: Uterus Measurements: 7.2 x 3.1 x 4.6 cm. No fibroids or other mass visualized. Endometrium Thickness: 12.4 mm.  No focal abnormality visualized. Right ovary Measurements: 3.8 x 2.7 x 3.5 cm. Multiple follicles. Hypoechoic region in the right ovary likely a complex cyst measuring 3 cm. Normal appearance/no adnexal mass. Left ovary Measurements: 3.8 x 1.3 x 3.4 cm. Normal appearance/no adnexal mass. Pulsed Doppler evaluation of both ovaries demonstrates normal  low-resistance arterial and venous waveforms. Other findings Small amount of free fluid. IMPRESSION: No acute abnormality in the pelvis. Electronically Signed   By: Marlan Palauharles  Clark M.D.   On: 12/05/2015 07:56    Procedures Procedures (including critical care time)  Medications Ordered in ED Medications  fentaNYL (SUBLIMAZE) injection 50 mcg (50 mcg Intravenous Given 12/05/15 0633)  ondansetron (ZOFRAN) injection 4 mg (4 mg Intravenous Given 12/05/15 0630)     Initial Impression / Assessment and Plan / ED Course  I have reviewed the triage vital signs and the nursing notes.  Pertinent labs & imaging results that were available during my care of the patient were reviewed by me and considered in my medical decision making (see chart for details).  Clinical Course    Afebrile, nontoxic patient with pelvic pain that woke her from sleep.  Labs, UA unremarkable.  Pelvic US demonstrates multiple follicles and 3cm cyst in right ovary without torsion.    D/C home with motrin, gyn follow up.  Discussed result, findings, treatment, and follow up  with patient.  Pt given return precautions.  Pt verbalizes understanding and agrees with plan.        Final Clinical Impressions(s) / ED Diagnoses   Final diagnoses:  Pelvic pain in female  Lower abdominal pain  Cyst of right ovary    New Prescriptions Discharge Medication List as of 12/05/2015  8:42 AM    START taking these medications   Details  ibuprofen (ADVIL,MOTRIN) 800 MG tablet Take 1 tablet (800 mg total) by mouth every 8 (eight) hours as needed for mild pain or moderate pain., Starting Thu 12/05/2015, Print         GillhamEmily Maelani Yarbro, PA-C 12/05/15 1003    Paula LibraJohn Molpus, MD 12/05/15 2252

## 2015-12-06 LAB — URINE CULTURE

## 2015-12-11 ENCOUNTER — Inpatient Hospital Stay (HOSPITAL_COMMUNITY)
Admission: AD | Admit: 2015-12-11 | Discharge: 2015-12-11 | Disposition: A | Discharge status: Home / Self Care | Payer: BLUE CROSS/BLUE SHIELD | Source: Ambulatory Visit | Attending: Family Medicine | Admitting: Family Medicine

## 2015-12-11 ENCOUNTER — Inpatient Hospital Stay (HOSPITAL_COMMUNITY): Payer: BLUE CROSS/BLUE SHIELD

## 2015-12-11 DIAGNOSIS — R109 Unspecified abdominal pain: Secondary | ICD-10-CM

## 2015-12-11 DIAGNOSIS — Z79899 Other long term (current) drug therapy: Secondary | ICD-10-CM | POA: Diagnosis not present

## 2015-12-11 DIAGNOSIS — N83201 Unspecified ovarian cyst, right side: Secondary | ICD-10-CM

## 2015-12-11 LAB — URINALYSIS, ROUTINE W REFLEX MICROSCOPIC
BILIRUBIN URINE: NEGATIVE
Glucose, UA: NEGATIVE mg/dL
Hgb urine dipstick: NEGATIVE
KETONES UR: NEGATIVE mg/dL
LEUKOCYTES UA: NEGATIVE
NITRITE: NEGATIVE
PROTEIN: NEGATIVE mg/dL
Specific Gravity, Urine: 1.01 (ref 1.005–1.030)
pH: 6.5 (ref 5.0–8.0)

## 2015-12-11 LAB — CBC WITH DIFFERENTIAL/PLATELET
BASOS ABS: 0 10*3/uL (ref 0.0–0.1)
BASOS PCT: 0 %
EOS ABS: 0.2 10*3/uL (ref 0.0–0.7)
Eosinophils Relative: 2 %
HCT: 36.3 % (ref 36.0–46.0)
HEMOGLOBIN: 13 g/dL (ref 12.0–15.0)
Lymphocytes Relative: 39 %
Lymphs Abs: 3.5 10*3/uL (ref 0.7–4.0)
MCH: 29.5 pg (ref 26.0–34.0)
MCHC: 35.8 g/dL (ref 30.0–36.0)
MCV: 82.5 fL (ref 78.0–100.0)
Monocytes Absolute: 0.6 10*3/uL (ref 0.1–1.0)
Monocytes Relative: 7 %
NEUTROS ABS: 4.7 10*3/uL (ref 1.7–7.7)
NEUTROS PCT: 52 %
PLATELETS: 220 10*3/uL (ref 150–400)
RBC: 4.4 MIL/uL (ref 3.87–5.11)
RDW: 12.8 % (ref 11.5–15.5)
WBC: 9 10*3/uL (ref 4.0–10.5)

## 2015-12-11 MED ORDER — KETOROLAC TROMETHAMINE 60 MG/2ML IM SOLN
60.0000 mg | Freq: Once | INTRAMUSCULAR | Status: AC
  Administered 2015-12-11: 60 mg via INTRAMUSCULAR
  Filled 2015-12-11: qty 2

## 2015-12-11 MED ORDER — EPINEPHRINE 0.3 MG/0.3ML IJ DEVI: INTRAMUSCULAR | 3 refills | Status: DC

## 2015-12-11 MED ORDER — OXYCODONE-ACETAMINOPHEN 5-325 MG PO TABS
1.0000 | ORAL_TABLET | Freq: Once | ORAL | Status: AC
  Administered 2015-12-11: 1 via ORAL
  Filled 2015-12-11: qty 1

## 2015-12-11 MED ORDER — OXYCODONE-ACETAMINOPHEN 5-325 MG PO TABS: 1.0000 | ORAL_TABLET | ORAL | 0 refills | Status: DC | PRN

## 2015-12-11 NOTE — MAU Provider Note (Signed)
History     CSN: [id number]  Arrival date and time: 12/11/15 0149   None     No chief complaint on file.  HPI   Ms.[name] is a 20 y.o. female with a known ovarian cyst here tonight with abdominal pain.  [facility] and diagnosed with a hemorrhagic cyst. She was given 800 mg Ibuprofen tabs and instructed to return here if symptoms worsened.  The pain subsided throughout the week and the ibuprofen helped. She took the ibuprofen last night and it did not help. She is here curled up in a ball on the bed due to the pain. She is here with her significant other.   The pain is located in the center of her abdomen, the pain is constant. The pain is worse than it was the last time she was seen.  She denies vomiting or fever.  OB History    No data available      Past Medical History:  Diagnosis Date  . Allergy   . 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 Bronchitis    Fall 2013, prescribed inhaler, does not take inhaler now.  . Chest pain   . Palpitations     Past Surgical History:  Procedure Laterality Date  . LAPAROSCOPIC APPENDECTOMY N/A 05/27/2012   Procedure: APPENDECTOMY LAPAROSCOPIC;  Surgeon: Judie PetitM. Leonia CoronaShuaib Farooqui, MD;  Location: [facility] OR;  Service: Pediatrics;  Laterality: N/A;  . OTHER SURGICAL HISTORY     splinter R foot req surgery, L foot glass required surgery, dates unknown    Family History  Problem Relation Age of Onset  . Diabetes Mother   . Hypertension Mother   . Alcohol abuse Mother   . Appendicitis Brother   . Drug abuse Brother   . Cancer Maternal Aunt   . Cancer Paternal Uncle   . Diabetes Maternal Grandmother   . Diabetes Maternal Grandfather   . Hypertension Maternal Grandfather   . Cancer Paternal Grandmother     liver cancer    Social History  Substance Use Topics  . Smoking status: Never Smoker  . Smokeless tobacco: Never Used  . Alcohol use No    Allergies:   Allergies  Allergen Reactions  . Bee Venom Anaphylaxis  . Red Dye Anaphylaxis  . Wasp Venom Anaphylaxis  . Latex     Prescriptions Prior to Admission  Medication Sig Dispense Refill Last Dose  . EPINEPHrine (EPIPEN) 0.3 mg/0.3 mL DEVI tud 1 Device 2 unknown at unknown  . ibuprofen (ADVIL,MOTRIN) 800 MG tablet Take 1 tablet (800 mg total) by mouth every 8 (eight) hours as needed for mild pain or moderate pain. 15 tablet 0   . Multiple Vitamin (MULTIVITAMIN WITH MINERALS) TABS tablet Take 1 tablet by mouth daily.   12/04/2015 at Unknown time   Results for orders placed or performed during the hospital encounter of 12/11/15 (from the past 48 hour(s))  CBC with Differential     Status: None   Collection Time: 12/11/15  2:20 AM  Result Value Ref Range   WBC 9.0 4.0 - 10.5 K/uL   RBC 4.40 3.87 - 5.11 MIL/uL   Hemoglobin 13.0 12.0 - 15.0 g/dL   HCT 40.936.3 81.136.0 - 91.446.0 %   MCV 82.5 78.0 - 100.0 fL   MCH 29.5 26.0 - 34.0 pg   MCHC 35.8 30.0 - 36.0 g/dL   RDW 78.212.8 95.611.5 -  15.5 %   Platelets 220 150 - 400 K/uL   Neutrophils Relative % 52 %   Neutro Abs 4.7 1.7 - 7.7 K/uL   Lymphocytes Relative 39 %   Lymphs Abs 3.5 0.7 - 4.0 K/uL   Monocytes Relative 7 %   Monocytes Absolute 0.6 0.1 - 1.0 K/uL   Eosinophils Relative 2 %   Eosinophils Absolute 0.2 0.0 - 0.7 K/uL   Basophils Relative 0 %   Basophils Absolute 0.0 0.0 - 0.1 K/uL  Urinalysis, Routine w reflex microscopic (not at [facility])     Status: None   Collection Time: 12/11/15  2:35 AM  Result Value Ref Range   Color, Urine YELLOW YELLOW   APPearance CLEAR CLEAR   Specific Gravity, Urine 1.010 1.005 - 1.030   pH 6.5 5.0 - 8.0   Glucose, UA NEGATIVE NEGATIVE mg/dL   Hgb urine dipstick NEGATIVE NEGATIVE   Bilirubin Urine NEGATIVE NEGATIVE   Ketones, ur NEGATIVE NEGATIVE mg/dL   Protein, ur NEGATIVE NEGATIVE mg/dL   Nitrite NEGATIVE NEGATIVE   Leukocytes, UA NEGATIVE NEGATIVE    Comment: MICROSCOPIC NOT DONE ON URINES WITH NEGATIVE  PROTEIN, BLOOD, LEUKOCYTES, NITRITE, OR GLUCOSE <1000 mg/dL.   [place] Transvaginal Non-ob  Result Date: 12/11/2015 CLINICAL DATA:  Abdominal pain , right lower quadrant for 5 days EXAM: TRANSVAGINAL ULTRASOUND OF PELVIS DOPPLER ULTRASOUND OF OVARIES TECHNIQUE: Transvaginal ultrasound examination of the pelvis was performed including evaluation of the uterus, ovaries, adnexal regions, and pelvic cul-de-sac. Color and duplex Doppler ultrasound was utilized to evaluate blood flow to the ovaries. COMPARISON:  12/05/2015 FINDINGS: Uterus Measurements: 7 x 4 x 5 cm. No fibroids or other mass visualized. Endometrium Thickness: 8 mm.  No focal abnormality visualized. Right ovary Measurements: 36 x 34 x 30 mm. Crenulated area with peripheral hypervascularity compatible with a collapsed cyst/corpus luteum, decreased in size from prior. Left ovary Measurements: 38 x 15 x 23 mm. Normal appearance/no adnexal mass. Pulsed Doppler evaluation demonstrates normal low-resistance arterial and venous waveforms in both ovaries. Small simple pelvic fluid that has increased from prior. IMPRESSION: 1. Negative for ovarian torsion. 2. Small volume, simple pelvic fluid is mildly increased from 12/05/2015. Electronically Signed   By: Marnee SpringJonathon  Watts M.D.   On: 12/11/2015 03:15   [place] Art/ven Flow Abd Pelv Doppler  Result Date: 12/11/2015 CLINICAL DATA:  Abdominal pain , right lower quadrant for 5 days EXAM: TRANSVAGINAL ULTRASOUND OF PELVIS DOPPLER ULTRASOUND OF OVARIES TECHNIQUE: Transvaginal ultrasound examination of the pelvis was performed including evaluation of the uterus, ovaries, adnexal regions, and pelvic cul-de-sac. Color and duplex Doppler ultrasound was utilized to evaluate blood flow to the ovaries. COMPARISON:  12/05/2015 FINDINGS: Uterus Measurements: 7 x 4 x 5 cm. No fibroids or other mass visualized. Endometrium Thickness: 8 mm.  No focal abnormality visualized. Right ovary Measurements: 36 x 34 x 30 mm. Crenulated area  with peripheral hypervascularity compatible with a collapsed cyst/corpus luteum, decreased in size from prior. Left ovary Measurements: 38 x 15 x 23 mm. Normal appearance/no adnexal mass. Pulsed Doppler evaluation demonstrates normal low-resistance arterial and venous waveforms in both ovaries. Small simple pelvic fluid that has increased from prior. IMPRESSION: 1. Negative for ovarian torsion. 2. Small volume, simple pelvic fluid is mildly increased from 12/05/2015. Electronically Signed   By: Marnee SpringJonathon  Watts M.D.   On: 12/11/2015 03:15   Review of Systems  Constitutional: Negative for chills and fever.  Gastrointestinal: Positive for abdominal pain and nausea. Negative for vomiting.   Physical  Exam   Blood pressure 105/62, pulse 66, temperature 98.3 F (36.8 C), temperature source Oral, resp. rate 16, last menstrual period 11/17/2015, SpO2 100 %.  Physical Exam  Constitutional: She is oriented to person, place, and time. She appears well-developed and well-nourished. She appears distressed.  Neck: Neck supple.  GI: Soft. There is tenderness in the right lower quadrant, suprapubic area and left lower quadrant. There is rigidity. There is no rebound and no guarding.  Musculoskeletal: Normal range of motion.  Neurological: She is alert and oriented to person, place, and time.  Skin: Skin is warm. She is not diaphoretic.  Psychiatric: Her behavior is normal.    MAU Course  Procedures  None  MDM  Toradol 60 mg IM Percocet 1 tab given PO [place] to evaluate for ovarian torsion.  Patient rates her pain 0/10 after medication. [place] without sign of torsion. Some improvement since previous U/S.   Assessment and Plan   A:  1. Abdominal pain   2. Right ovarian cyst     P:  Discharge home in stable condition Rx: Percocet Ok to continue ibuprofen Return precautions discussed Discussed follow up: [facility]  Information given.   Duane Lope, NP 12/14/2015 8:20 PM

## 2015-12-11 NOTE — Discharge Instructions (Signed)
Ovarian Cyst An ovarian cyst is a fluid-filled sac that forms on an ovary. The ovaries are small organs that produce eggs in women. Various types of cysts can form on the ovaries. Most are not cancerous. Many do not cause problems, and they often go away on their own. Some may cause symptoms and require treatment. Common types of ovarian cysts include:  Functional cysts--These cysts may occur every month during the menstrual cycle. This is normal. The cysts usually go away with the next menstrual cycle if the woman does not get pregnant. Usually, there are no symptoms with a functional cyst.  Endometrioma cysts--These cysts form from the tissue that lines the uterus. They are also called "chocolate cysts" because they become filled with blood that turns brown. This type of cyst can cause pain in the lower abdomen during intercourse and with your menstrual period.  Cystadenoma cysts--This type develops from the cells on the outside of the ovary. These cysts can get very big and cause lower abdomen pain and pain with intercourse. This type of cyst can twist on itself, cut off its blood supply, and cause severe pain. It can also easily rupture and cause a lot of pain.  Dermoid cysts--This type of cyst is sometimes found in both ovaries. These cysts may contain different kinds of body tissue, such as skin, teeth, hair, or cartilage. They usually do not cause symptoms unless they get very big.  Theca lutein cysts--These cysts occur when too much of a certain hormone (human chorionic gonadotropin) is produced and overstimulates the ovaries to produce an egg. This is most common after procedures used to assist with the conception of a baby (in vitro fertilization). CAUSES   Fertility drugs can cause a condition in which multiple large cysts are formed on the ovaries. This is called ovarian hyperstimulation syndrome.  A condition called polycystic ovary syndrome can cause hormonal imbalances that can lead to  nonfunctional ovarian cysts. SIGNS AND SYMPTOMS  Many ovarian cysts do not cause symptoms. If symptoms are present, they may include:  Pelvic pain or pressure.  Pain in the lower abdomen.  Pain during sexual intercourse.  Increasing girth (swelling) of the abdomen.  Abnormal menstrual periods.  Increasing pain with menstrual periods.  Stopping having menstrual periods without being pregnant. DIAGNOSIS  These cysts are commonly found during a routine or annual pelvic exam. Tests may be ordered to find out more about the cyst. These tests may include:  Ultrasound.  X-ray of the pelvis.  CT scan.  MRI.  Blood tests. TREATMENT  Many ovarian cysts go away on their own without treatment. Your health care provider may want to check your cyst regularly for 2-3 months to see if it changes. For women in menopause, it is particularly important to monitor a cyst closely because of the higher rate of ovarian cancer in menopausal women. When treatment is needed, it may include any of the following:  A procedure to drain the cyst (aspiration). This may be done using a long needle and ultrasound. It can also be done through a laparoscopic procedure. This involves using a thin, lighted tube with a tiny camera on the end (laparoscope) inserted through a small incision.  Surgery to remove the whole cyst. This may be done using laparoscopic surgery or an open surgery involving a larger incision in the lower abdomen.  Hormone treatment or birth control pills. These methods are sometimes used to help dissolve a cyst. HOME CARE INSTRUCTIONS   Only take over-the-counter   or prescription medicines as directed by your health care provider.  Follow up with your health care provider as directed.  Get regular pelvic exams and Pap tests. SEEK MEDICAL CARE IF:   Your periods are late, irregular, or painful, or they stop.  Your pelvic pain or abdominal pain does not go away.  Your abdomen becomes  larger or swollen.  You have pressure on your bladder or trouble emptying your bladder completely.  You have pain during sexual intercourse.  You have feelings of fullness, pressure, or discomfort in your stomach.  You lose weight for no apparent reason.  You feel generally ill.  You become constipated.  You lose your appetite.  You develop acne.  You have an increase in body and facial hair.  You are gaining weight, without changing your exercise and eating habits.  You think you are pregnant. SEEK IMMEDIATE MEDICAL CARE IF:   You have increasing abdominal pain.  You feel sick to your stomach (nauseous), and you throw up (vomit).  You develop a fever that comes on suddenly.  You have abdominal pain during a bowel movement.  Your menstrual periods become heavier than usual. MAKE SURE YOU:  Understand these instructions.  Will watch your condition.  Will get help right away if you are not doing well or get worse.   This information is not intended to replace advice given to you by your health care provider. Make sure you discuss any questions you have with your health care provider.   Document Released: 04/06/2005 Document Revised: 04/11/2013 Document Reviewed: 12/12/2012 Elsevier Interactive Patient Education 2016 [employer]  

## 2016-01-10 ENCOUNTER — Ambulatory Visit (INDEPENDENT_AMBULATORY_CARE_PROVIDER_SITE_OTHER): Payer: BLUE CROSS/BLUE SHIELD | Admitting: Obstetrics & Gynecology

## 2016-01-10 ENCOUNTER — Encounter: Payer: Self-pay | Admitting: Obstetrics & Gynecology

## 2016-01-10 VITALS — BP 108/68 | HR 81 | Wt 161.0 lb

## 2016-01-10 DIAGNOSIS — R110 Nausea: Secondary | ICD-10-CM

## 2016-01-10 DIAGNOSIS — R102 Pelvic and perineal pain: Secondary | ICD-10-CM | POA: Diagnosis not present

## 2016-01-10 DIAGNOSIS — Z3202 Encounter for pregnancy test, result negative: Secondary | ICD-10-CM | POA: Diagnosis not present

## 2016-01-10 LAB — POCT PREGNANCY, URINE: Preg Test, Ur: POSITIVE — AB

## 2016-01-10 NOTE — Progress Notes (Signed)
   Subjective:    Patient ID: [name], female    DOB: 02-23-1996, 20 y.o.   MRN: [id number]  HPI 20 yo MW G1 here to follow up after a MAU visit for pelvic pain. She has had 3 u/s s this year, all normal. She has been using timing/condoms for contraceptions but feels like she may be pregnant. Her pelvic pain has resolved and now she has only mild cramping with NO bleeding.  Review of Systems     Objective:   Physical Exam [id number] Breathing, conversing, and ambulating normally Abd- benign UPT-+       Assessment & Plan:  Approximately [redacted] weeks EGA Rec PNV daily (She has been on MVI daily and has already purchased PNV. Rec start PNC at about 10 weeks

## 2016-02-04 ENCOUNTER — Inpatient Hospital Stay (HOSPITAL_COMMUNITY)
Admission: AD | Admit: 2016-02-04 | Discharge: 2016-02-04 | Disposition: A | Discharge status: Home / Self Care | Payer: BLUE CROSS/BLUE SHIELD | Source: Ambulatory Visit | Attending: Obstetrics & Gynecology | Admitting: Obstetrics & Gynecology

## 2016-02-04 ENCOUNTER — Inpatient Hospital Stay (HOSPITAL_COMMUNITY): Payer: BLUE CROSS/BLUE SHIELD

## 2016-02-04 ENCOUNTER — Encounter (HOSPITAL_COMMUNITY): Payer: Self-pay | Admitting: *Deleted

## 2016-02-04 DIAGNOSIS — O209 Hemorrhage in early pregnancy, unspecified: Secondary | ICD-10-CM

## 2016-02-04 DIAGNOSIS — O200 Threatened abortion: Secondary | ICD-10-CM | POA: Diagnosis not present

## 2016-02-04 DIAGNOSIS — Z3A01 Less than 8 weeks gestation of pregnancy: Secondary | ICD-10-CM | POA: Diagnosis not present

## 2016-02-04 DIAGNOSIS — O4690 Antepartum hemorrhage, unspecified, unspecified trimester: Secondary | ICD-10-CM

## 2016-02-04 DIAGNOSIS — O26899 Other specified pregnancy related conditions, unspecified trimester: Secondary | ICD-10-CM

## 2016-02-04 DIAGNOSIS — R1030 Lower abdominal pain, unspecified: Secondary | ICD-10-CM | POA: Diagnosis present

## 2016-02-04 DIAGNOSIS — R102 Pelvic and perineal pain: Secondary | ICD-10-CM

## 2016-02-04 LAB — CBC
HEMATOCRIT: 38.1 % (ref 36.0–46.0)
HEMOGLOBIN: 13.4 g/dL (ref 12.0–15.0)
MCH: 30 pg (ref 26.0–34.0)
MCHC: 35.2 g/dL (ref 30.0–36.0)
MCV: 85.4 fL (ref 78.0–100.0)
Platelets: 238 10*3/uL (ref 150–400)
RBC: 4.46 MIL/uL (ref 3.87–5.11)
RDW: 12.8 % (ref 11.5–15.5)
WBC: 11.9 10*3/uL — ABNORMAL HIGH (ref 4.0–10.5)

## 2016-02-04 LAB — WET PREP, GENITAL
CLUE CELLS WET PREP: NONE SEEN
SPERM: NONE SEEN
Trich, Wet Prep: NONE SEEN
WBC WET PREP: NONE SEEN
YEAST WET PREP: NONE SEEN

## 2016-02-04 LAB — ABO/RH: ABO/RH(D): A POS

## 2016-02-04 LAB — HCG, QUANTITATIVE, PREGNANCY: HCG, BETA CHAIN, QUANT, S: 6222 m[IU]/mL — AB (ref <5)

## 2016-02-04 MED ORDER — MORPHINE SULFATE (PF) 4 MG/ML IV SOLN
4.0000 mg | Freq: Once | INTRAVENOUS | Status: AC
  Administered 2016-02-04: 4 mg via INTRAMUSCULAR
  Filled 2016-02-04: qty 1

## 2016-02-04 MED ORDER — PROMETHAZINE HCL 25 MG/ML IJ SOLN
12.5000 mg | Freq: Once | INTRAMUSCULAR | Status: AC
  Administered 2016-02-04: 12.5 mg via INTRAMUSCULAR
  Filled 2016-02-04: qty 1

## 2016-02-04 NOTE — MAU Provider Note (Signed)
History     CSN: [id number]  Arrival date and time: 02/04/16 1719   None     Chief Complaint  Patient presents with  . Abdominal Cramping  . Vaginal Bleeding   G1 @[redacted]w[redacted]d  by LMP here with lower abdominal cramping over the last 4 hrs. She also report bright red bleeding about 1 hr later. She describes as more than spotting but less than menses. No recent fevers. She had one episode of N/V after the cramping started.     OB History    Gravida Para Term Preterm AB Living   1             SAB TAB Ectopic Multiple Live Births                  Past Medical History:  Diagnosis Date  . Allergy   . 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 Bronchitis    Fall 2013, prescribed inhaler, does not take inhaler now.  . Chest pain   . Palpitations     Past Surgical History:  Procedure Laterality Date  . LAPAROSCOPIC APPENDECTOMY N/A 05/27/2012   Procedure: APPENDECTOMY LAPAROSCOPIC;  Surgeon: Judie Petit. Leonia Corona, MD;  Location: [facility] OR;  Service: Pediatrics;  Laterality: N/A;  . OTHER SURGICAL HISTORY     splinter R foot req surgery, L foot glass required surgery, dates unknown    Family History  Problem Relation Age of Onset  . Diabetes Mother   . Hypertension Mother   . Alcohol abuse Mother   . Appendicitis Brother   . Drug abuse Brother   . Cancer Maternal Aunt   . Cancer Paternal Uncle   . Diabetes Maternal Grandmother   . Diabetes Maternal Grandfather   . Hypertension Maternal Grandfather   . Cancer Paternal Grandmother     liver cancer    Social History  Substance Use Topics  . Smoking status: Never Smoker  . Smokeless tobacco: Never Used  . Alcohol use No    Allergies:  Allergies  Allergen Reactions  . Bee Venom Anaphylaxis  . Red Dye Anaphylaxis  . Wasp Venom Anaphylaxis  . Latex     Prescriptions Prior to Admission  Medication Sig Dispense Refill Last Dose  . EPINEPHrine (EPI-PEN) 0.3 mg/0.3 mL DEVI tud 1 Device 3    . ibuprofen (ADVIL,MOTRIN) 800 MG tablet Take 1 tablet (800 mg total) by mouth every 8 (eight) hours as needed for mild pain or moderate pain. 15 tablet 0   . Multiple Vitamin (MULTIVITAMIN WITH MINERALS) TABS tablet Take 1 tablet by mouth daily.   12/04/2015 at Unknown time  . oxyCODONE-acetaminophen (PERCOCET/ROXICET) 5-325 MG tablet Take 1-2 tablets by mouth every 4 (four) hours as needed for severe pain. 15 tablet 0     Review of Systems  Constitutional: Negative.   Gastrointestinal: Positive for abdominal pain, nausea and vomiting.  Genitourinary: Negative.   Neurological: Negative.    Physical Exam   Blood pressure 120/81, pulse 95, temperature 98.5 F (36.9 C), resp. rate 18, weight 72.6 kg (160 lb), last menstrual period 12/16/2015.  Physical Exam  Constitutional: She is oriented to person, place, and time. She appears well-developed and well-nourished. She is cooperative. She appears distressed (crying).  HENT:  Head: Normocephalic and atraumatic.  Neck: Normal range of motion. Neck supple.  Cardiovascular: Normal rate.   Respiratory: Effort normal.  GI: Soft. She exhibits no distension and no mass. There  is tenderness (exquisitley, lower quadrants). There is guarding. There is no rebound.  Genitourinary:  Genitourinary Comments: External: no lesions Vagina: rugated, parous/ nulli, moderate drk bloody discharge in vault, no active bleeding from os Uterus: ? enlarged, anteverted,  tender, no CMT Adnexae: no masses, mod tenderness left, mod tenderness right, cervix closed   Musculoskeletal: Normal range of motion.  Neurological: She is alert and oriented to person, place, and time.  Skin: Skin is warm and dry.  Psychiatric: She has a normal mood and affect.   Results for orders placed or performed during the hospital encounter of 02/04/16 (from the past 24 hour(s))  Wet prep, genital     Status: None   Collection Time: 02/04/16  6:00 PM  Result Value Ref Range   Yeast  Wet Prep HPF POC NONE SEEN NONE SEEN   Trich, Wet Prep NONE SEEN NONE SEEN   Clue Cells Wet Prep HPF POC NONE SEEN NONE SEEN   WBC, Wet Prep HPF POC NONE SEEN NONE SEEN   Sperm NONE SEEN   ABO/Rh     Status: None (Preliminary result)   Collection Time: 02/04/16  6:12 PM  Result Value Ref Range   ABO/RH(D) A POS   CBC     Status: Abnormal   Collection Time: 02/04/16  6:15 PM  Result Value Ref Range   WBC 11.9 (H) 4.0 - 10.5 K/uL   RBC 4.46 3.87 - 5.11 MIL/uL   Hemoglobin 13.4 12.0 - 15.0 g/dL   HCT 40.938.1 81.136.0 - 91.446.0 %   MCV 85.4 78.0 - 100.0 fL   MCH 30.0 26.0 - 34.0 pg   MCHC 35.2 30.0 - 36.0 g/dL   RDW 78.212.8 95.611.5 - 21.315.5 %   Platelets 238 150 - 400 K/uL  hCG, quantitative, pregnancy     Status: Abnormal   Collection Time: 02/04/16  6:15 PM  Result Value Ref Range   hCG, Beta Chain, Quant, S 6,222 (H) <5 mIU/mL   [place] Ob Comp Less 14 Wks  Result Date: 02/04/2016 CLINICAL DATA:  First trimester pregnancy, pelvic pain. Vaginal bleeding. EXAM: OBSTETRIC <14 WK US AND TRANSVAGINAL OB US TECHNIQUE: Both transabdominal and transvaginal ultrasound examinations were performed for complete evaluation of the gestation as well as the maternal uterus, adnexal regions, and pelvic cul-de-sac. Transvaginal technique was performed to assess early pregnancy. COMPARISON:  Ultrasound of December 11, 2015. FINDINGS: Intrauterine gestational sac: Single. Yolk sac:  Visualized. Embryo:  Visualized. Cardiac Activity: None. CRL:  5.9  mm   6 w   2 d                  US EDC: September 27, 2016. Subchorionic hemorrhage:  None visualized. Maternal uterus/adnexae: Normal ovaries. IMPRESSION: Probable early intrauterine gestational sac with yolk sac and fetal pole, but no cardiac activity yet visualized. Recommend follow-up quantitative B-HCG levels and follow-up US in 14 days to confirm and assess viability. This recommendation follows SRU consensus guidelines: Diagnostic Criteria for Nonviable Pregnancy Early in the First  Trimester. Malva Limes Engl J Med 2013; 086:5784-69; 369:1443-51. Electronically Signed   By: Lupita RaiderJames  Green Jr, M.D.   On: 02/04/2016 19:58   [place] Ob Transvaginal  Result Date: 02/04/2016 CLINICAL DATA:  First trimester pregnancy, pelvic pain. Vaginal bleeding. EXAM: OBSTETRIC <14 WK US AND TRANSVAGINAL OB US TECHNIQUE: Both transabdominal and transvaginal ultrasound examinations were performed for complete evaluation of the gestation as well as the maternal uterus, adnexal regions, and pelvic cul-de-sac. Transvaginal technique was performed to  assess early pregnancy. COMPARISON:  Ultrasound of December 11, 2015. FINDINGS: Intrauterine gestational sac: Single. Yolk sac:  Visualized. Embryo:  Visualized. Cardiac Activity: None. CRL:  5.9  mm   6 w   2 d                  [place] EDC: September 27, 2016. Subchorionic hemorrhage:  None visualized. Maternal uterus/adnexae: Normal ovaries. IMPRESSION: Probable early intrauterine gestational sac with yolk sac and fetal pole, but no cardiac activity yet visualized. Recommend follow-up quantitative B-HCG levels and follow-up US in 14 days to confirm and assess viability. This recommendation follows SRU consensus guidelines: Diagnostic Criteria for Nonviable Pregnancy Early in the First Trimester. Malva Limes Med 2013; 161:0960-45. Electronically Signed   By: Lupita Raider, M.D.   On: 02/04/2016 19:58    MAU Course  Procedures Morphine Phenergan Results for orders placed or performed during the hospital encounter of 02/04/16 (from the past 24 hour(s))  Wet prep, genital     Status: None   Collection Time: 02/04/16  6:00 PM  Result Value Ref Range   Yeast Wet Prep HPF POC NONE SEEN NONE SEEN   Trich, Wet Prep NONE SEEN NONE SEEN   Clue Cells Wet Prep HPF POC NONE SEEN NONE SEEN   WBC, Wet Prep HPF POC NONE SEEN NONE SEEN   Sperm NONE SEEN   ABO/Rh     Status: None   Collection Time: 02/04/16  6:12 PM  Result Value Ref Range   ABO/RH(D) A POS   CBC     Status: Abnormal   Collection Time:  02/04/16  6:15 PM  Result Value Ref Range   WBC 11.9 (H) 4.0 - 10.5 K/uL   RBC 4.46 3.87 - 5.11 MIL/uL   Hemoglobin 13.4 12.0 - 15.0 g/dL   HCT 40.9 81.1 - 91.4 %   MCV 85.4 78.0 - 100.0 fL   MCH 30.0 26.0 - 34.0 pg   MCHC 35.2 30.0 - 36.0 g/dL   RDW 78.2 95.6 - 21.3 %   Platelets 238 150 - 400 K/uL  hCG, quantitative, pregnancy     Status: Abnormal   Collection Time: 02/04/16  6:15 PM  Result Value Ref Range   hCG, Beta Chain, Quant, S 6,222 (H) <5 mIU/mL  US Ob Comp Less 14 Wks  Result Date: 02/04/2016 CLINICAL DATA:  First trimester pregnancy, pelvic pain. Vaginal bleeding. EXAM: OBSTETRIC <14 WK [place] AND TRANSVAGINAL OB US TECHNIQUE: Both transabdominal and transvaginal ultrasound examinations were performed for complete evaluation of the gestation as well as the maternal uterus, adnexal regions, and pelvic cul-de-sac. Transvaginal technique was performed to assess early pregnancy. COMPARISON:  Ultrasound of December 11, 2015. FINDINGS: Intrauterine gestational sac: Single. Yolk sac:  Visualized. Embryo:  Visualized. Cardiac Activity: None. CRL:  5.9  mm   6 w   2 d                  [place] EDC: September 27, 2016. Subchorionic hemorrhage:  None visualized. Maternal uterus/adnexae: Normal ovaries. IMPRESSION: Probable early intrauterine gestational sac with yolk sac and fetal pole, but no cardiac activity yet visualized. Recommend follow-up quantitative B-HCG levels and follow-up US in 14 days to confirm and assess viability. This recommendation follows SRU consensus guidelines: Diagnostic Criteria for Nonviable Pregnancy Early in the First Trimester. Malva Limes Med 2013; 086:5784-69. Electronically Signed   By: Lupita Raider, M.D.   On: 02/04/2016 19:58   US Ob Transvaginal  Result Date: 02/04/2016 CLINICAL DATA:  First trimester pregnancy, pelvic pain. Vaginal bleeding. EXAM: OBSTETRIC <14 WK [place] AND TRANSVAGINAL OB US TECHNIQUE: Both transabdominal and transvaginal ultrasound examinations were  performed for complete evaluation of the gestation as well as the maternal uterus, adnexal regions, and pelvic cul-de-sac. Transvaginal technique was performed to assess early pregnancy. COMPARISON:  Ultrasound of December 11, 2015. FINDINGS: Intrauterine gestational sac: Single. Yolk sac:  Visualized. Embryo:  Visualized. Cardiac Activity: None. CRL:  5.9  mm   6 w   2 d                  [place] EDC: September 27, 2016. Subchorionic hemorrhage:  None visualized. Maternal uterus/adnexae: Normal ovaries. IMPRESSION: Probable early intrauterine gestational sac with yolk sac and fetal pole, but no cardiac activity yet visualized. Recommend follow-up quantitative B-HCG levels and follow-up US in 14 days to confirm and assess viability. This recommendation follows SRU consensus guidelines: Diagnostic Criteria for Nonviable Pregnancy Early in the First Trimester. Malva Limes Med 2013; 086:5784-69. Electronically Signed   By: Lupita Raider, M.D.   On: 02/04/2016 19:58   MDM Labs and [place] ordered. Report and transfer of care given to S. Chase Picket, NP Donette Larry, CNM  02/04/2016 8:14 PM  Discussed with pt and husband concern about failed pregnancy and threatened miscarriage Pain controlled with morphine and phenergan in MAU Advised pt to return if increase in pain or bleeding  Assessment and Plan  Bleeding in pregnancy Threatened miscarriage- [redacted]w[redacted]d no CA Repeat US in 1 week- order submitted and f/u in clinic for results Return to MAU if increase in pain or bleeding  Jean Rosenthal, NP

## 2016-02-04 NOTE — Discharge Instructions (Signed)

## 2016-02-04 NOTE — MAU Note (Signed)
Pt presents to MAU with complaints of lower abdominal cramping that started today around 2am. States the pain is constant with scant amount of vaginal bleeding

## 2016-02-05 ENCOUNTER — Inpatient Hospital Stay (HOSPITAL_COMMUNITY)
Admission: AD | Admit: 2016-02-05 | Discharge: 2016-02-06 | Disposition: A | Discharge status: Home / Self Care | Payer: BLUE CROSS/BLUE SHIELD | Source: Ambulatory Visit | Attending: Obstetrics & Gynecology | Admitting: Obstetrics & Gynecology

## 2016-02-05 DIAGNOSIS — O039 Complete or unspecified spontaneous abortion without complication: Secondary | ICD-10-CM | POA: Insufficient documentation

## 2016-02-05 DIAGNOSIS — Z6791 Unspecified blood type, Rh negative: Secondary | ICD-10-CM | POA: Insufficient documentation

## 2016-02-05 DIAGNOSIS — Z6790 Unspecified blood type, Rh positive: Secondary | ICD-10-CM

## 2016-02-05 LAB — GC/CHLAMYDIA PROBE AMP (~~LOC~~) NOT AT ARMC
Chlamydia: NEGATIVE
NEISSERIA GONORRHEA: NEGATIVE

## 2016-02-05 NOTE — MAU Note (Signed)
Pt presents stating she has continued to bleed today with one episode of very heavy bleeding that she feels like she passed the pregnancy during. States she is still having pain that is better than it was.

## 2016-02-06 ENCOUNTER — Encounter (HOSPITAL_COMMUNITY): Payer: Self-pay

## 2016-02-06 DIAGNOSIS — Z6791 Unspecified blood type, Rh negative: Secondary | ICD-10-CM | POA: Diagnosis not present

## 2016-02-06 DIAGNOSIS — Z6790 Unspecified blood type, Rh positive: Secondary | ICD-10-CM

## 2016-02-06 DIAGNOSIS — O039 Complete or unspecified spontaneous abortion without complication: Secondary | ICD-10-CM | POA: Diagnosis not present

## 2016-02-06 DIAGNOSIS — R109 Unspecified abdominal pain: Secondary | ICD-10-CM | POA: Diagnosis present

## 2016-02-06 LAB — CBC
HCT: 39.4 % (ref 36.0–46.0)
Hemoglobin: 13.6 g/dL (ref 12.0–15.0)
MCH: 29.8 pg (ref 26.0–34.0)
MCHC: 34.5 g/dL (ref 30.0–36.0)
MCV: 86.4 fL (ref 78.0–100.0)
PLATELETS: 266 10*3/uL (ref 150–400)
RBC: 4.56 MIL/uL (ref 3.87–5.11)
RDW: 12.9 % (ref 11.5–15.5)
WBC: 8.6 10*3/uL (ref 4.0–10.5)

## 2016-02-06 LAB — HCG, QUANTITATIVE, PREGNANCY: hCG, Beta Chain, Quant, S: 2127 m[IU]/mL — ABNORMAL HIGH (ref <5)

## 2016-02-06 MED ORDER — KETOROLAC TROMETHAMINE 30 MG/ML IJ SOLN
30.0000 mg | Freq: Once | INTRAMUSCULAR | Status: AC
  Administered 2016-02-06: 30 mg via INTRAVENOUS
  Filled 2016-02-06: qty 1

## 2016-02-06 MED ORDER — KETOROLAC TROMETHAMINE 30 MG/ML IJ SOLN: 30.0000 mg | Freq: Once | INTRAMUSCULAR | Status: DC

## 2016-02-06 NOTE — Discharge Instructions (Signed)
Miscarriage  A miscarriage is the sudden loss of an unborn baby (fetus) before the 20th week of pregnancy. Most miscarriages happen in the first 3 months of pregnancy. Sometimes, it happens before a woman even knows she is pregnant. A miscarriage is also called a "spontaneous miscarriage" or "early pregnancy loss." Having a miscarriage can be an emotional experience. Talk with your caregiver about any questions you may have about miscarrying, the grieving process, and your future pregnancy plans.  CAUSES    Problems with the fetal chromosomes that make it impossible for the baby to develop normally. Problems with the baby's genes or chromosomes are most often the result of errors that occur, by chance, as the embryo divides and grows. The problems are not inherited from the parents.   Infection of the cervix or uterus.    Hormone problems.    Problems with the cervix, such as having an incompetent cervix. This is when the tissue in the cervix is not strong enough to hold the pregnancy.    Problems with the uterus, such as an abnormally shaped uterus, uterine fibroids, or congenital abnormalities.    Certain medical conditions.    Smoking, drinking alcohol, or taking illegal drugs.    Trauma.   Often, the cause of a miscarriage is unknown.   SYMPTOMS    Vaginal bleeding or spotting, with or without cramps or pain.   Pain or cramping in the abdomen or lower back.   Passing fluid, tissue, or blood clots from the vagina.  DIAGNOSIS   Your caregiver will perform a physical exam. You may also have an ultrasound to confirm the miscarriage. Blood or urine tests may also be ordered.  TREATMENT    Sometimes, treatment is not necessary if you naturally pass all the fetal tissue that was in the uterus. If some of the fetus or placenta remains in the body (incomplete miscarriage), tissue left behind may become infected and must be removed. Usually, a dilation and curettage (D and C) procedure is performed.  During a D and C procedure, the cervix is widened (dilated) and any remaining fetal or placental tissue is gently removed from the uterus.   Antibiotic medicines are prescribed if there is an infection. Other medicines may be given to reduce the size of the uterus (contract) if there is a lot of bleeding.   If you have Rh negative blood and your baby was Rh positive, you will need a Rh immunoglobulin shot. This shot will protect any future baby from having Rh blood problems in future pregnancies.  HOME CARE INSTRUCTIONS    Your caregiver may order bed rest or may allow you to continue light activity. Resume activity as directed by your caregiver.   Have someone help with home and family responsibilities during this time.    Keep track of the number of sanitary pads you use each day and how soaked (saturated) they are. Write down this information.    Do not use tampons. Do not douche or have sexual intercourse until approved by your caregiver.    Only take over-the-counter or prescription medicines for pain or discomfort as directed by your caregiver.    Do not take aspirin. Aspirin can cause bleeding.    Keep all follow-up appointments with your caregiver.    If you or your partner have problems with grieving, talk to your caregiver or seek counseling to help cope with the pregnancy loss. Allow enough time to grieve before trying to get pregnant again.     SEEK IMMEDIATE MEDICAL CARE IF:    You have severe cramps or pain in your back or abdomen.   You have a fever.   You pass large blood clots (walnut-sized or larger) ortissue from your vagina. Save any tissue for your caregiver to inspect.    Your bleeding increases.    You have a thick, bad-smelling vaginal discharge.   You become lightheaded, weak, or you faint.    You have chills.   MAKE SURE YOU:   Understand these instructions.   Will watch your condition.   Will get help right away if you are not doing well or get worse.     This  information is not intended to replace advice given to you by your health care provider. Make sure you discuss any questions you have with your health care provider.     Document Released: 09/30/2000 Document Revised: 08/01/2012 Document Reviewed: 05/26/2011  Elsevier Interactive Patient Education 2016 [employer]vier [employer]

## 2016-02-06 NOTE — MAU Provider Note (Signed)
History     CSN: [id number]  Arrival date and time: 02/05/16 2344   None     Chief Complaint  Patient presents with  . Abdominal Pain   G1 @[redacted]w[redacted]d  by LMP here after passing ?POC at home. She reports vaginal bleeding that increased last night and in to today and then she passed some tissue about 2 hrs ago. VB has slowed since and cramping has improved but not resolved completely. She also reports feeling dizzy with standing that started earlier today. She was seen yesterday in MAU for VB and cramping and US revealed CRL of [redacted]w[redacted]d with no cardiac activity.    OB History    Gravida Para Term Preterm AB Living   1             SAB TAB Ectopic Multiple Live Births                  Past Medical History:  Diagnosis Date  . Allergy   . 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 Bronchitis    Fall 2013, prescribed inhaler, does not take inhaler now.  . Chest pain   . Palpitations     Past Surgical History:  Procedure Laterality Date  . LAPAROSCOPIC APPENDECTOMY N/A 05/27/2012   Procedure: APPENDECTOMY LAPAROSCOPIC;  Surgeon: Judie Petit. Leonia Corona, MD;  Location: [facility] OR;  Service: Pediatrics;  Laterality: N/A;  . OTHER SURGICAL HISTORY     splinter R foot req surgery, L foot glass required surgery, dates unknown    Family History  Problem Relation Age of Onset  . Diabetes Mother   . Hypertension Mother   . Alcohol abuse Mother   . Appendicitis Brother   . Drug abuse Brother   . Cancer Maternal Aunt   . Cancer Paternal Uncle   . Diabetes Maternal Grandmother   . Diabetes Maternal Grandfather   . Hypertension Maternal Grandfather   . Cancer Paternal Grandmother     liver cancer    Social History  Substance Use Topics  . Smoking status: Never Smoker  . Smokeless tobacco: Never Used  . Alcohol use No    Allergies:  Allergies  Allergen Reactions  . Bee Venom Anaphylaxis  . Red Dye Anaphylaxis  . Wasp Venom Anaphylaxis  . Latex      Prescriptions Prior to Admission  Medication Sig Dispense Refill Last Dose  . EPINEPHrine (EPI-PEN) 0.3 mg/0.3 mL DEVI tud 1 Device 3   . Multiple Vitamin (MULTIVITAMIN WITH MINERALS) TABS tablet Take 1 tablet by mouth daily.   12/04/2015 at Unknown time    Review of Systems  Constitutional: Negative.   Gastrointestinal: Positive for abdominal pain and nausea. Negative for vomiting.  Neurological: Positive for dizziness.   Physical Exam   Blood pressure 111/78, pulse 78, temperature 98 F (36.7 C), temperature source Oral, resp. rate 18, last menstrual period 12/16/2015.  Physical Exam  Constitutional: She is oriented to person, place, and time. She appears well-developed and well-nourished.  HENT:  Head: Normocephalic and atraumatic.  Neck: Normal range of motion. Neck supple.  Cardiovascular: Normal rate.   Respiratory: Effort normal.  Genitourinary:  Genitourinary Comments: External: no lesions Vagina: rugated, parous, scant drk red bloody discharge, os closed    Musculoskeletal: Normal range of motion.  Neurological: She is alert and oriented to person, place, and time.  Skin: Skin is warm and dry.  Psychiatric: She has a normal mood and affect.  Results for orders placed or performed during the hospital encounter of 02/05/16 (from the past 24 hour(s))  CBC     Status: None   Collection Time: 02/06/16 12:10 AM  Result Value Ref Range   WBC 8.6 4.0 - 10.5 K/uL   RBC 4.56 3.87 - 5.11 MIL/uL   Hemoglobin 13.6 12.0 - 15.0 g/dL   HCT 16.139.4 09.636.0 - 04.546.0 %   MCV 86.4 78.0 - 100.0 fL   MCH 29.8 26.0 - 34.0 pg   MCHC 34.5 30.0 - 36.0 g/dL   RDW 40.912.9 81.111.5 - 91.415.5 %   Platelets 266 150 - 400 K/uL  hCG, quantitative, pregnancy     Status: Abnormal   Collection Time: 02/06/16 12:10 AM  Result Value Ref Range   hCG, Beta Chain, Quant, S 2,127 (H) <5 mIU/mL    MAU Course  Procedures LR 1 L bolus Toradol  MDM Labs ordered and reviewed. Inspection of tissue she brought  with her reveals small grey colored sac. Recommend sending to pathology but pt and spouse strongly prefer to keep POC and decline this. Pain improved after Toradol. Bleeding scant. VSS. Stable for discharge home. Declines Rx for pain med.   Assessment and Plan   1. Spontaneous abortion   2. Blood type, Rh positive    [facility]    Medication List    TAKE these medications   EPINEPHrine 0.3 mg/0.3 mL Devi Commonly known as:  EPI-PEN tud   multivitamin with minerals Tabs tablet Take 1 tablet by mouth daily.     Ibuprofen OTC prn  Donette LarryMelanie Cabell Lazenby, CNM 02/06/2016, 12:29 AM

## 2016-02-10 ENCOUNTER — Ambulatory Visit (HOSPITAL_COMMUNITY): Payer: BLUE CROSS/BLUE SHIELD | Attending: Gynecology

## 2016-02-20 ENCOUNTER — Ambulatory Visit: Payer: BLUE CROSS/BLUE SHIELD | Admitting: Obstetrics & Gynecology

## 2016-04-20 NOTE — L&D Delivery Note (Signed)
3Delivery Note At  a viable female was delivered via  (Presentation:OA ;  ).  APGAR:9 ,10 ; weight pending  .   Placenta status: complete with accessory lobe, . 3V Cord:  with the following complications: None.    Anesthesia:  Epidural Episiotomy:  None Lacerations:  2nd Suture Repair: 2.0 chromic Est. Blood Loss (mL):  150cc  Mom to postpartum.  Baby to Couplet care / Skin to Skin.  [name] [name] 04/13/2017, 8:28 PM

## 2016-08-14 LAB — OB RESULTS CONSOLE GC/CHLAMYDIA
Chlamydia: NEGATIVE
Gonorrhea: NEGATIVE

## 2016-08-14 LAB — OB RESULTS CONSOLE ANTIBODY SCREEN: ANTIBODY SCREEN: NEGATIVE

## 2016-08-14 LAB — OB RESULTS CONSOLE HEPATITIS B SURFACE ANTIGEN: HEP B S AG: NEGATIVE

## 2016-08-14 LAB — OB RESULTS CONSOLE RPR: RPR: NONREACTIVE

## 2016-08-14 LAB — OB RESULTS CONSOLE ABO/RH: RH Type: POSITIVE

## 2016-08-14 LAB — OB RESULTS CONSOLE HIV ANTIBODY (ROUTINE TESTING): HIV: NONREACTIVE

## 2016-09-07 ENCOUNTER — Inpatient Hospital Stay (HOSPITAL_COMMUNITY)
Admission: AD | Admit: 2016-09-07 | Discharge: 2016-09-07 | Disposition: A | Discharge status: Home / Self Care | Payer: BLUE CROSS/BLUE SHIELD | Source: Ambulatory Visit | Attending: Obstetrics & Gynecology | Admitting: Obstetrics & Gynecology

## 2016-09-07 ENCOUNTER — Encounter (HOSPITAL_COMMUNITY): Payer: Self-pay | Admitting: *Deleted

## 2016-09-07 DIAGNOSIS — E860 Dehydration: Secondary | ICD-10-CM

## 2016-09-07 DIAGNOSIS — O210 Mild hyperemesis gravidarum: Secondary | ICD-10-CM | POA: Diagnosis not present

## 2016-09-07 DIAGNOSIS — O219 Vomiting of pregnancy, unspecified: Secondary | ICD-10-CM

## 2016-09-07 DIAGNOSIS — O26891 Other specified pregnancy related conditions, first trimester: Secondary | ICD-10-CM | POA: Diagnosis not present

## 2016-09-07 DIAGNOSIS — Z3A08 8 weeks gestation of pregnancy: Secondary | ICD-10-CM | POA: Insufficient documentation

## 2016-09-07 HISTORY — DX: Anemia, unspecified: D64.9

## 2016-09-07 HISTORY — DX: Unspecified ovarian cyst, unspecified side: N83.209

## 2016-09-07 HISTORY — DX: Unspecified infectious disease: B99.9

## 2016-09-07 HISTORY — DX: Headache: R51

## 2016-09-07 HISTORY — DX: Headache, unspecified: R51.9

## 2016-09-07 LAB — POCT PREGNANCY, URINE: Preg Test, Ur: POSITIVE — AB

## 2016-09-07 LAB — COMPREHENSIVE METABOLIC PANEL
ALT: 14 U/L (ref 14–54)
AST: 16 U/L (ref 15–41)
Albumin: 4.1 g/dL (ref 3.5–5.0)
Alkaline Phosphatase: 41 U/L (ref 38–126)
Anion gap: 7 (ref 5–15)
BUN: 8 mg/dL (ref 6–20)
CO2: 23 mmol/L (ref 22–32)
Calcium: 9.4 mg/dL (ref 8.9–10.3)
Chloride: 106 mmol/L (ref 101–111)
Creatinine, Ser: 0.43 mg/dL — ABNORMAL LOW (ref 0.44–1.00)
GFR calc Af Amer: >60 mL/min (ref >60)
GFR calc non Af Amer: >60 mL/min (ref >60)
Glucose, Bld: 81 mg/dL (ref 65–99)
Potassium: 3.9 mmol/L (ref 3.5–5.1)
Sodium: 136 mmol/L (ref 135–145)
Total Bilirubin: 0.5 mg/dL (ref 0.3–1.2)
Total Protein: 6.8 g/dL (ref 6.5–8.1)

## 2016-09-07 LAB — CBC
HCT: 37.6 % (ref 36.0–46.0)
HEMOGLOBIN: 13 g/dL (ref 12.0–15.0)
MCH: 30.3 pg (ref 26.0–34.0)
MCHC: 34.6 g/dL (ref 30.0–36.0)
MCV: 87.6 fL (ref 78.0–100.0)
Platelets: 222 10*3/uL (ref 150–400)
RBC: 4.29 MIL/uL (ref 3.87–5.11)
RDW: 13.2 % (ref 11.5–15.5)
WBC: 10.3 10*3/uL (ref 4.0–10.5)

## 2016-09-07 LAB — URINALYSIS, ROUTINE W REFLEX MICROSCOPIC
Bilirubin Urine: NEGATIVE
GLUCOSE, UA: NEGATIVE mg/dL
Hgb urine dipstick: NEGATIVE
Ketones, ur: 20 mg/dL — AB
LEUKOCYTES UA: NEGATIVE
Nitrite: NEGATIVE
PH: 7 (ref 5.0–8.0)
PROTEIN: NEGATIVE mg/dL
Specific Gravity, Urine: 1.02 (ref 1.005–1.030)

## 2016-09-07 MED ORDER — METOCLOPRAMIDE HCL 5 MG/ML IJ SOLN
10.0000 mg | Freq: Once | INTRAMUSCULAR | Status: AC
  Administered 2016-09-07: 10 mg via INTRAVENOUS
  Filled 2016-09-07: qty 2

## 2016-09-07 MED ORDER — LACTATED RINGERS IV BOLUS (SEPSIS)
1000.0000 mL | Freq: Once | INTRAVENOUS | Status: AC
  Administered 2016-09-07: 1000 mL via INTRAVENOUS

## 2016-09-07 MED ORDER — METOCLOPRAMIDE HCL 10 MG PO TABS: 10.0000 mg | ORAL_TABLET | Freq: Three times a day (TID) | ORAL | 2 refills | Status: DC | PRN

## 2016-09-07 MED ORDER — DOXYLAMINE-PYRIDOXINE 10-10 MG PO TBEC: DELAYED_RELEASE_TABLET | ORAL | 5 refills | Status: DC

## 2016-09-07 MED ORDER — ONDANSETRON HCL 4 MG/2ML IJ SOLN
4.0000 mg | Freq: Once | INTRAMUSCULAR | Status: DC
  Filled 2016-09-07: qty 2

## 2016-09-07 MED ORDER — DOXYLAMINE-PYRIDOXINE 10-10 MG PO TBEC
1.0000 | DELAYED_RELEASE_TABLET | Freq: Every day | ORAL | Status: DC
  Filled 2016-09-07 (×2): qty 1

## 2016-09-07 NOTE — MAU Note (Signed)
States feeling dizzy.  IV infused, add'l bag hung to give IVP meds.

## 2016-09-07 NOTE — MAU Provider Note (Addendum)
Chief Complaint: Morning Sickness and Dizziness   None     SUBJECTIVE HPI: [name] is a 21 y.o. G2P0010 at [redacted]w[redacted]d by LMP who presents to maternity admissions reporting nausea and vomiting 8-9 times in 24 hours making her unable to keep down food or fluids. She tried her Zofran PO, prescribed for n/v but was unable to keep it down.  She reports dizziness today with vomiting x5 since waking up.  Dizziness worsens when standing.  She denies any pain or other associated symptoms.  Nothing makes her symptoms better, eating makes them worse.   She denies vaginal bleeding, vaginal itching/burning, urinary symptoms, h/a,  or fever/chills.     HPI  Past Medical History:  Diagnosis Date  . Allergy   . 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 Anemia   . Bronchitis    Fall 2013, prescribed inhaler, does not take inhaler now.  . Chest pain   . Headache   . Infection    UTI  . Ovarian cyst   . Palpitations    Past Surgical History:  Procedure Laterality Date  . LAPAROSCOPIC APPENDECTOMY N/A 05/27/2012   Procedure: APPENDECTOMY LAPAROSCOPIC;  Surgeon: Judie Petit. Leonia Corona, MD;  Location: [facility] OR;  Service: Pediatrics;  Laterality: N/A;  . OTHER SURGICAL HISTORY     splinter R foot req surgery, L foot glass required surgery, dates unknown  . WISDOM TOOTH EXTRACTION     Social History   Social History  . Marital status: Married    Spouse name: N/A  . Number of children: N/A  . Years of education: N/A   Occupational History  . Not on file.   Social History Main Topics  . Smoking status: Never Smoker  . Smokeless tobacco: Never Used  . Alcohol use No  . Drug use: No  . Sexual activity: No   Other Topics Concern  . Not on file   Social History Narrative  . No narrative on file   No current facility-administered medications on file prior to encounter.    Current Outpatient Prescriptions on File Prior to Encounter  Medication Sig  Dispense Refill  . EPINEPHrine (EPI-PEN) 0.3 mg/0.3 mL DEVI tud (Patient taking differently: Inject 0.3 mg into the muscle once. tud) 1 Device 3   Allergies  Allergen Reactions  . Bee Venom Anaphylaxis  . Red Dye Anaphylaxis  . Wasp Venom Anaphylaxis  . Latex Rash    ROS:  Review of Systems  Constitutional: Negative for chills, fatigue and fever.  Respiratory: Negative for shortness of breath.   Cardiovascular: Negative for chest pain.  Gastrointestinal: Positive for nausea and vomiting. Negative for constipation and diarrhea.  Genitourinary: Negative for difficulty urinating, dysuria, flank pain, pelvic pain, vaginal bleeding, vaginal discharge and vaginal pain.  Neurological: Positive for dizziness. Negative for headaches.  Psychiatric/Behavioral: Negative.      I have reviewed patient's Past Medical Hx, Surgical Hx, Family Hx, Social Hx, medications and allergies.   Physical Exam   Patient Vitals for the past 24 hrs:  BP Temp Temp src Pulse Resp SpO2 Weight  09/07/16 1647 (!) 97/56 98.6 F (37 C) Oral 76 18 100 % -  09/07/16 1301 107/66 98.9 F (37.2 C) Oral 74 16 100 % 160 lb 4 oz (72.7 kg)   Constitutional: Well-developed, well-nourished female in no acute distress.  HEART: normal rate, heart sounds, regular rhythm RESP: normal effort, lung sounds clear and equal bilaterally  GI: Abd soft, non-tender. Pos BS x 4 MS: Extremities nontender, no edema, normal ROM Neurologic: Alert and oriented x 4.  GU: Neg CVAT.  PELVIC EXAM: Deferred   LAB RESULTS Results for orders placed or performed during the hospital encounter of 09/07/16 (from the past 24 hour(s))  Urinalysis, Routine w reflex microscopic     Status: Abnormal   Collection Time: 09/07/16  1:00 PM  Result Value Ref Range   Color, Urine YELLOW YELLOW   APPearance CLOUDY (A) CLEAR   Specific Gravity, Urine 1.020 1.005 - 1.030   pH 7.0 5.0 - 8.0   Glucose, UA NEGATIVE NEGATIVE mg/dL   Hgb urine dipstick  NEGATIVE NEGATIVE   Bilirubin Urine NEGATIVE NEGATIVE   Ketones, ur 20 (A) NEGATIVE mg/dL   Protein, ur NEGATIVE NEGATIVE mg/dL   Nitrite NEGATIVE NEGATIVE   Leukocytes, UA NEGATIVE NEGATIVE  Pregnancy, urine POC     Status: Abnormal   Collection Time: 09/07/16  1:14 PM  Result Value Ref Range   Preg Test, Ur POSITIVE (A) NEGATIVE  CBC     Status: None   Collection Time: 09/07/16  2:00 PM  Result Value Ref Range   WBC 10.3 4.0 - 10.5 K/uL   RBC 4.29 3.87 - 5.11 MIL/uL   Hemoglobin 13.0 12.0 - 15.0 g/dL   HCT 16.1 09.6 - 04.5 %   MCV 87.6 78.0 - 100.0 fL   MCH 30.3 26.0 - 34.0 pg   MCHC 34.6 30.0 - 36.0 g/dL   RDW 40.9 81.1 - 91.4 %   Platelets 222 150 - 400 K/uL  Comprehensive metabolic panel     Status: Abnormal   Collection Time: 09/07/16  2:00 PM  Result Value Ref Range   Sodium 136 135 - 145 mmol/L   Potassium 3.9 3.5 - 5.1 mmol/L   Chloride 106 101 - 111 mmol/L   CO2 23 22 - 32 mmol/L   Glucose, Bld 81 65 - 99 mg/dL   BUN 8 6 - 20 mg/dL   Creatinine, Ser 7.82 (L) 0.44 - 1.00 mg/dL   Calcium 9.4 8.9 - 95.6 mg/dL   Total Protein 6.8 6.5 - 8.1 g/dL   Albumin 4.1 3.5 - 5.0 g/dL   AST 16 15 - 41 U/L   ALT 14 14 - 54 U/L   Alkaline Phosphatase 41 38 - 126 U/L   Total Bilirubin 0.5 0.3 - 1.2 mg/dL   GFR calc non Af Amer >60 >60 mL/min   GFR calc Af Amer >60 >60 mL/min   Anion gap 7 5 - 15    --/--/A POS (10/17 1812)  IMAGING No results found.  MAU Management/MDM: Ordered labs (CBC, CMP) and reviewed results.    IV started with LR x 1000 ml, Reglan 10 mg IV, Zofran 4 mg IV   Consult Deania Siguenza with assessment and findings.  D/C home and add Reglan Rx to pt home meds, continue Zofran as prescribed.  Pt has red dye allergy and cannot take Diclegis. Pt to f/u in office as planned but call for visit sooner if symptoms persist. Return to MAU as needed for emergencies. Pt stable at time of discharge.  ASSESSMENT 1. Nausea and vomiting during pregnancy prior to [redacted] weeks  gestation   2. Mild dehydration     PLAN Discharge home  Allergies as of 09/07/2016      Reactions   Bee Venom Anaphylaxis   Red Dye Anaphylaxis   Wasp Venom Anaphylaxis   Latex Rash  Medication List    TAKE these medications   EPINEPHrine 0.3 mg/0.3 mL Devi Commonly known as:  EPI-PEN tud What changed:  how much to take  how to take this  when to take this  additional instructions   metoCLOPramide 10 MG tablet Commonly known as:  REGLAN Take 1 tablet (10 mg total) by mouth every 8 (eight) hours as needed for nausea.   ondansetron 8 MG disintegrating tablet Commonly known as:  ZOFRAN-ODT Take 1 tablet by mouth every 8 (eight) hours as needed for nausea or vomiting.   prenatal multivitamin Tabs tablet Take 1 tablet by mouth at bedtime.      Follow-up Information    Hoover BrownsKulwa, Macsen Nuttall, MD Follow up.   Specialty:  Obstetrics and Gynecology Why:  As scheduled, or call the office if symptoms persist.  Return to MAU as needed for emergencies. Contact information: [address] [place] [id number] [phone]           Sharen CounterLisa Leftwich-Kirby Certified Nurse-Midwife 09/07/2016  10:21 PM  Addendum:   CNM was instructed to ensure that patient can tolerate an oral intake before discharge from the hospital.   Dr. Sallye OberKulwa.

## 2016-09-07 NOTE — MAU Note (Signed)
Vomiting is getting worse, unable to keep zofran down. dizzy

## 2016-11-22 ENCOUNTER — Encounter (HOSPITAL_COMMUNITY): Payer: Self-pay | Admitting: *Deleted

## 2016-11-22 ENCOUNTER — Inpatient Hospital Stay (HOSPITAL_COMMUNITY)
Admission: AD | Admit: 2016-11-22 | Discharge: 2016-11-22 | Disposition: A | Discharge status: Home / Self Care | Payer: BLUE CROSS/BLUE SHIELD | Source: Ambulatory Visit | Attending: Obstetrics and Gynecology | Admitting: Obstetrics and Gynecology

## 2016-11-22 DIAGNOSIS — O2342 Unspecified infection of urinary tract in pregnancy, second trimester: Secondary | ICD-10-CM | POA: Diagnosis not present

## 2016-11-22 DIAGNOSIS — Z3A19 19 weeks gestation of pregnancy: Secondary | ICD-10-CM | POA: Insufficient documentation

## 2016-11-22 DIAGNOSIS — R109 Unspecified abdominal pain: Secondary | ICD-10-CM | POA: Diagnosis present

## 2016-11-22 LAB — URINALYSIS, ROUTINE W REFLEX MICROSCOPIC
Bilirubin Urine: NEGATIVE
GLUCOSE, UA: NEGATIVE mg/dL
KETONES UR: NEGATIVE mg/dL
Nitrite: NEGATIVE
PROTEIN: 30 mg/dL — AB
Specific Gravity, Urine: 1.01 (ref 1.005–1.030)
pH: 7 (ref 5.0–8.0)

## 2016-11-22 MED ORDER — ONDANSETRON HCL 4 MG PO TABS
4.0000 mg | ORAL_TABLET | Freq: Once | ORAL | Status: AC
  Administered 2016-11-22: 4 mg via ORAL
  Filled 2016-11-22: qty 1

## 2016-11-22 MED ORDER — AMOXICILLIN-POT CLAVULANATE 875-125 MG PO TABS: 1.0000 | ORAL_TABLET | Freq: Two times a day (BID) | ORAL | 0 refills | Status: DC

## 2016-11-22 MED ORDER — AMOXICILLIN-POT CLAVULANATE 875-125 MG PO TABS
1.0000 | ORAL_TABLET | Freq: Two times a day (BID) | ORAL | Status: DC
  Administered 2016-11-22: 1 via ORAL
  Filled 2016-11-22: qty 1

## 2016-11-22 NOTE — MAU Note (Signed)
Chief Complaint: Abdominal Pain; Urinary Frequency; and Vaginal Bleeding   First Provider Initiated Contact with Patient 11/22/16 1047     SUBJECTIVE HPI: [name] is a 21 y.o. G2P0010 at 478w2d who presents to Maternity Admissions reporting urinary frequency and urgency this this am.  Thinks she saw blood when wiping.  Has had one UTI during this pregnancy due to ecoli treated with Augmentin.  TOC negative.    Location: lower abdomen Quality: cramping Severity: 5/10 on pain scale Duration: today   Past Medical History:  Diagnosis Date  . Allergy   . 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 Anemia   . Bronchitis    Fall 2013, prescribed inhaler, does not take inhaler now.  . Chest pain   . Headache   . Infection    UTI  . Ovarian cyst   . Palpitations    OB History  Gravida Para Term Preterm AB Living  2       1    SAB TAB Ectopic Multiple Live Births  1            # Outcome Date GA Lbr Len/2nd Weight Sex Delivery Anes PTL Lv  2 Current           1 SAB              Past Surgical History:  Procedure Laterality Date  . LAPAROSCOPIC APPENDECTOMY N/A 05/27/2012   Procedure: APPENDECTOMY LAPAROSCOPIC;  Surgeon: Judie PetitM. Leonia CoronaShuaib Farooqui, MD;  Location: [facility] OR;  Service: Pediatrics;  Laterality: N/A;  . OTHER SURGICAL HISTORY     splinter R foot req surgery, L foot glass required surgery, dates unknown  . WISDOM TOOTH EXTRACTION     Social History   Social History  . Marital status: Married    Spouse name: N/A  . Number of children: N/A  . Years of education: N/A   Occupational History  . Not on file.   Social History Main Topics  . Smoking status: Never Smoker  . Smokeless tobacco: Never Used  . Alcohol use No  . Drug use: No  . Sexual activity: No   Other Topics Concern  . Not on file   Social History Narrative  . No narrative on file   Family History  Problem Relation Age of Onset  . Diabetes Mother   .  Hypertension Mother   . Alcohol abuse Mother   . Appendicitis Brother   . Drug abuse Brother   . Cancer Maternal Aunt   . Cancer Paternal Uncle   . Diabetes Maternal Grandmother   . Diabetes Maternal Grandfather   . Hypertension Maternal Grandfather   . Cancer Paternal Grandmother        liver cancer   No current facility-administered medications on file prior to encounter.    Current Outpatient Prescriptions on File Prior to Encounter  Medication Sig Dispense Refill  . EPINEPHrine (EPI-PEN) 0.3 mg/0.3 mL DEVI tud (Patient taking differently: Inject 0.3 mg into the muscle once. tud) 1 Device 3  . metoCLOPramide (REGLAN) 10 MG tablet Take 1 tablet (10 mg total) by mouth every 8 (eight) hours as needed for nausea. 60 tablet 2  . ondansetron (ZOFRAN-ODT) 8 MG disintegrating tablet Take 1 tablet by mouth every 8 (eight) hours as needed for nausea or vomiting.     . Prenatal Vit-Fe Fumarate-FA (PRENATAL MULTIVITAMIN) TABS tablet Take 1 tablet by mouth at bedtime.  Allergies  Allergen Reactions  . Bee Venom Anaphylaxis  . Red Dye Anaphylaxis  . Wasp Venom Anaphylaxis  . Latex Rash    I have reviewed patient's Past Medical Hx, Surgical Hx, Family Hx, Social Hx, medications and allergies.   Review of Systems  Constitutional: Negative.   HENT: Negative.   Eyes: Negative.   Respiratory: Negative.   Cardiovascular: Negative.   Gastrointestinal: Positive for abdominal pain.  Genitourinary: Positive for dysuria, frequency and hematuria.  Musculoskeletal: Negative.   Allergic/Immunologic: Negative.   Neurological: Negative.   Hematological: Negative.   Psychiatric/Behavioral: Negative.     OBJECTIVE Patient Vitals for the past 24 hrs:  BP Temp Temp src Pulse Resp SpO2  11/22/16 1019 (!) 108/58 97.9 F (36.6 C) Oral 88 18 100 %   Constitutional: Well-developed, well-nourished female in mild acute distress.  Cardiovascular: normal rate Respiratory: normal rate and effort.   GI: Abd soft, tender lower abdomen, gravid appropriate for gestational age.  Pos BS x 4 FHT 134 MS: Extremities nontender, no edema, normal ROM Neurologic: Alert and oriented x 4.  GU: Positive CVAT on right side  SPECULUM EXAM: NEFG, physiologic discharge, no blood noted, cervix clean    LAB RESULTS Results for orders placed or performed during the hospital encounter of 11/22/16 (from the past 24 hour(s))  Urinalysis, Routine w reflex microscopic     Status: Abnormal   Collection Time: 11/22/16 10:00 AM  Result Value Ref Range   Color, Urine YELLOW YELLOW   APPearance CLOUDY (A) CLEAR   Specific Gravity, Urine 1.010 1.005 - 1.030   pH 7.0 5.0 - 8.0   Glucose, UA NEGATIVE NEGATIVE mg/dL   Hgb urine dipstick LARGE (A) NEGATIVE   Bilirubin Urine NEGATIVE NEGATIVE   Ketones, ur NEGATIVE NEGATIVE mg/dL   Protein, ur 30 (A) NEGATIVE mg/dL   Nitrite NEGATIVE NEGATIVE   Leukocytes, UA LARGE (A) NEGATIVE   RBC / HPF 6-30 0 - 5 RBC/hpf   WBC, UA TOO NUMEROUS TO COUNT 0 - 5 WBC/hpf   Bacteria, UA RARE (A) NONE SEEN   Squamous Epithelial / LPF 0-5 (A) NONE SEEN   WBC Clumps PRESENT    Non Squamous Epithelial 0-5 (A) NONE SEEN    IMAGING No results found.  MAU COURSE Orders Placed This Encounter  Procedures  . Urinalysis, Routine w reflex microscopic   No orders of the defined types were placed in this encounter.   MDM UA  ASSESSMENT UTI in pregnancy  PLAN Discharge home in stable condition.  Will treat with Augmentin due to red dye allergy.  Discussed risk factors of pyelonephritis.  Increase fluids.     Kenney Housemanrothero, [name], CNM 11/22/2016  10:57 AM

## 2016-11-22 NOTE — MAU Note (Signed)
Informed Bernerd PhoNancy Prothero, CNM  That patient is here in MAU.  VSS, urinary frequency, FHT 134.  She stated she was in house and would see patient.

## 2016-11-22 NOTE — MAU Note (Signed)
Pt. States " I have had urinary frequency for a couple of days" +bleeding started this a.m. When she stands the urgency gets worse +cramping in lower pelvis started this a.m.  Pt. States hx. Of miscarriage 01/2016. Hx of ov cysts.

## 2016-11-22 NOTE — Discharge Instructions (Signed)

## 2016-11-23 LAB — CULTURE, OB URINE

## 2016-12-10 ENCOUNTER — Inpatient Hospital Stay (HOSPITAL_COMMUNITY): Payer: BLUE CROSS/BLUE SHIELD

## 2016-12-10 ENCOUNTER — Encounter (HOSPITAL_COMMUNITY): Payer: Self-pay | Admitting: *Deleted

## 2016-12-10 ENCOUNTER — Inpatient Hospital Stay (HOSPITAL_COMMUNITY)
Admission: AD | Admit: 2016-12-10 | Discharge: 2016-12-10 | Disposition: A | Discharge status: Home / Self Care | Payer: BLUE CROSS/BLUE SHIELD | Source: Ambulatory Visit | Attending: Obstetrics and Gynecology | Admitting: Obstetrics and Gynecology

## 2016-12-10 DIAGNOSIS — N83209 Unspecified ovarian cyst, unspecified side: Secondary | ICD-10-CM | POA: Insufficient documentation

## 2016-12-10 DIAGNOSIS — N200 Calculus of kidney: Secondary | ICD-10-CM

## 2016-12-10 DIAGNOSIS — Z87440 Personal history of urinary (tract) infections: Secondary | ICD-10-CM | POA: Insufficient documentation

## 2016-12-10 DIAGNOSIS — M549 Dorsalgia, unspecified: Secondary | ICD-10-CM

## 2016-12-10 DIAGNOSIS — Z3A21 21 weeks gestation of pregnancy: Secondary | ICD-10-CM | POA: Insufficient documentation

## 2016-12-10 DIAGNOSIS — O9989 Other specified diseases and conditions complicating pregnancy, childbirth and the puerperium: Secondary | ICD-10-CM

## 2016-12-10 DIAGNOSIS — O3482 Maternal care for other abnormalities of pelvic organs, second trimester: Secondary | ICD-10-CM | POA: Insufficient documentation

## 2016-12-10 DIAGNOSIS — O26892 Other specified pregnancy related conditions, second trimester: Secondary | ICD-10-CM | POA: Diagnosis not present

## 2016-12-10 DIAGNOSIS — N3941 Urge incontinence: Secondary | ICD-10-CM | POA: Diagnosis present

## 2016-12-10 DIAGNOSIS — O99891 Other specified diseases and conditions complicating pregnancy: Secondary | ICD-10-CM

## 2016-12-10 LAB — BASIC METABOLIC PANEL
ANION GAP: 7 (ref 5–15)
BUN: 13 mg/dL (ref 6–20)
CALCIUM: 8.7 mg/dL — AB (ref 8.9–10.3)
CO2: 24 mmol/L (ref 22–32)
CREATININE: 0.44 mg/dL (ref 0.44–1.00)
Chloride: 105 mmol/L (ref 101–111)
GLUCOSE: 74 mg/dL (ref 65–99)
Potassium: 3.7 mmol/L (ref 3.5–5.1)
Sodium: 136 mmol/L (ref 135–145)

## 2016-12-10 LAB — URINALYSIS, ROUTINE W REFLEX MICROSCOPIC
BILIRUBIN URINE: NEGATIVE
Glucose, UA: NEGATIVE mg/dL
Hgb urine dipstick: NEGATIVE
KETONES UR: NEGATIVE mg/dL
LEUKOCYTES UA: NEGATIVE
NITRITE: NEGATIVE
PROTEIN: NEGATIVE mg/dL
Specific Gravity, Urine: 1.034 — ABNORMAL HIGH (ref 1.005–1.030)
pH: 5 (ref 5.0–8.0)

## 2016-12-10 MED ORDER — IBUPROFEN 600 MG PO TABS
600.0000 mg | ORAL_TABLET | Freq: Once | ORAL | Status: AC
  Administered 2016-12-10: 600 mg via ORAL
  Filled 2016-12-10: qty 1

## 2016-12-10 MED ORDER — OXYCODONE-ACETAMINOPHEN 5-325 MG PO TABS: 1.0000 | ORAL_TABLET | ORAL | 0 refills | Status: DC | PRN

## 2016-12-10 NOTE — MAU Provider Note (Signed)
History  [name] is a 21 yo G2P0010 @ 21.6 wks sent from the office for further evaluation due to excruciating back pain, L>R, worse when walking. She called the office today and was given a same day appt.   She states pain started yesterday, and she was already hurting by the time she woke up at 7 am. Rated pain as 7/10, as she does today. 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 Took 2 Tylenol tabs yesterday which did not alleviate her pain. Has not taken any other medication. Endorses FM. Denies LOF, cramping or VB.   States still has painful/burning sensation when she urinates, urgency and incontinence. Was seen on 8/5 for these same sxs, but w/ blood in her urine. She was presumptively treated for a UTI and has completed abx course. There is no blood in her urine tonight.  Denies kidney stone hx, back issues, or any other health problems.  Of note: Pt had not been evaluated until I came on at 7 p.m.   Patient Active Problem List   Diagnosis Date Noted  . Back pain affecting pregnancy in second trimester 12/10/2016    Chief Complaint  Patient presents with  . Back Pain   HPI  As above  OB History    Gravida Para Term Preterm AB Living   2       1     SAB TAB Ectopic Multiple Live Births   1              Past Medical History:  Diagnosis Date  . Allergy   . 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 Anemia   . Bronchitis    Fall 2013, prescribed inhaler, does not take inhaler now.  . Chest pain   . Headache   . Infection    UTI  . Ovarian cyst   . Palpitations     Past Surgical History:  Procedure Laterality Date  . LAPAROSCOPIC APPENDECTOMY N/A 05/27/2012   Procedure: APPENDECTOMY LAPAROSCOPIC;  Surgeon: Judie Petit. Leonia Corona, MD;  Location: [facility] OR;  Service: Pediatrics;  Laterality: N/A;  . OTHER SURGICAL HISTORY     splinter R foot req surgery, L foot glass  required surgery, dates unknown  . WISDOM TOOTH EXTRACTION      Family History  Problem Relation Age of Onset  . Diabetes Mother   . Hypertension Mother   . Alcohol abuse Mother   . Appendicitis Brother   . Drug abuse Brother   . Cancer Maternal Aunt   . Cancer Paternal Uncle   . Diabetes Maternal Grandmother   . Diabetes Maternal Grandfather   . Hypertension Maternal Grandfather   . Cancer Paternal Grandmother        liver cancer    Social History  Substance Use Topics  . Smoking status: Never Smoker  . Smokeless tobacco: Never Used  . Alcohol use No    Allergies:  Allergies  Allergen Reactions  . Bee Venom Anaphylaxis  . Red Dye Anaphylaxis  . Wasp Venom Anaphylaxis  . Latex Rash    No prescriptions prior to admission.    ROS  As noted above Physical Exam   Blood pressure (!) 107/57, pulse (!) 101, temperature 98.2 F (36.8 C), temperature source Oral, resp. rate 18, weight 79 kg (174 lb 1.9 oz), last menstrual period 07/10/2016, SpO2 100 %,  unknown if currently breastfeeding.  Results for orders placed or performed during the hospital encounter of 12/10/16 (from the past 24 hour(s))  Urinalysis, Routine w reflex microscopic     Status: Abnormal   Collection Time: 12/10/16  6:12 PM  Result Value Ref Range   Color, Urine YELLOW YELLOW   APPearance HAZY (A) CLEAR   Specific Gravity, Urine 1.034 (H) 1.005 - 1.030   pH 5.0 5.0 - 8.0   Glucose, UA NEGATIVE NEGATIVE mg/dL   Hgb urine dipstick NEGATIVE NEGATIVE   Bilirubin Urine NEGATIVE NEGATIVE   Ketones, ur NEGATIVE NEGATIVE mg/dL   Protein, ur NEGATIVE NEGATIVE mg/dL   Nitrite NEGATIVE NEGATIVE   Leukocytes, UA NEGATIVE NEGATIVE  Basic metabolic panel     Status: Abnormal   Collection Time: 12/10/16  8:45 PM  Result Value Ref Range   Sodium 136 135 - 145 mmol/L   Potassium 3.7 3.5 - 5.1 mmol/L   Chloride 105 101 - 111 mmol/L   CO2 24 22 - 32 mmol/L   Glucose, Bld 74 65 - 99 mg/dL   BUN 13 6 - 20  mg/dL   Creatinine, Ser 0.10 0.44 - 1.00 mg/dL   Calcium 8.7 (L) 8.9 - 10.3 mg/dL   GFR calc non Af Amer >60 >60 mL/min   GFR calc Af Amer >60 >60 mL/min   Anion gap 7 5 - 15    Physical Exam  Gen: Grimacing. Unsteady gait due to back pain. Lungs: CTAB. CV: RRR w/o M/R/G. Back: Neg CVAT bilaterally, although entire back tender to touch. Abdomen: soft, NT, no rebound or guarding. Doptones: 141 bpm.  Renal U/S as follows:  CLINICAL DATA:  Pregnant patient with left back pain for 4 days.  EXAM: RENAL / URINARY TRACT ULTRASOUND COMPLETE  COMPARISON:  None.  FINDINGS: Right Kidney:  Length: 11.8 cm. Echogenicity within normal limits. No mass or hydronephrosis visualized.  Left Kidney:  Length: 11.6 cm. Echogenicity within normal limits. No mass or hydronephrosis visualized.  Bladder:  Appears normal for degree of bladder distention.  IMPRESSION: No hydronephrosis.   Electronically Signed   By: Annia Belt M.D.   On: 12/10/2016 22:03    ED Course  Ibuprofen BMP Renal U/S UA Urine culture  Assessment: Back pain in pregnancy. Ibuprofen eased pain. Nephrolithiasis ruled out. Await urine culture.  Plan: Rx'd Percocet tabs #10 NR for severe pain. Advised may take Ibuprofen as needed up until 28 wks. Encouraged to take medication before pain becomes severe. OOW until Monday, 12/14/16. Will have office schedule PT appointment to evaluate and assign back exercises. PTL precautions. Keep office appt on 12/22/16 - may be seen sooner if warranted.   Sherre Scarlet CNM, MS 12/10/16, 10:30 PM

## 2016-12-10 NOTE — MAU Note (Signed)
Urine in lab 

## 2016-12-10 NOTE — MAU Note (Signed)
L sided back pain Started Sunday and more severe starting yesterday Seen in OB office today per patient --+CVA tenderness Rating pain 8/10 Has not taken any medication today; tylenol yesterday

## 2016-12-10 NOTE — Discharge Instructions (Signed)
Back Pain in Pregnancy Back pain during pregnancy is common. Back pain may be caused by several factors that are related to changes during your pregnancy. Follow these instructions at home: Managing pain, stiffness, and swelling  If directed, apply ice for sudden (acute) back pain. ? Put ice in a plastic bag. ? Place a towel between your skin and the bag. ? Leave the ice on for 20 minutes, 2-3 times per day.  If directed, apply heat to the affected area before you exercise: ? Place a towel between your skin and the heat pack or heating pad. ? Leave the heat on for 20-30 minutes. ? Remove the heat if your skin turns bright red. This is especially important if you are unable to feel pain, heat, or cold. You may have a greater risk of getting burned. Activity  Exercise as told by your health care provider. Exercising is the best way to prevent or manage back pain.  Listen to your body when lifting. If lifting hurts, ask for help or bend your knees. This uses your leg muscles instead of your back muscles.  Squat down when picking up something from the floor. Do not bend over.  Only use bed rest as told by your health care provider. Bed rest should only be used for the most severe episodes of back pain. Standing, Sitting, and Lying Down  Do not stand in one place for long periods of time.  Use good posture when sitting. Make sure your head rests over your shoulders and is not hanging forward. Use a pillow on your lower back if necessary.  Try sleeping on your side, preferably the left side, with a pillow or two between your legs. If you are sore after a night's rest, your bed may be too soft. A firm mattress may provide more support for your back during pregnancy. General instructions  Do not wear high heels.  Eat a healthy diet. Try to gain weight within your health care provider's recommendations.  Use a maternity girdle, elastic sling, or back brace as told by your health care  provider.  Take over-the-counter and prescription medicines only as told by your health care provider.  Keep all follow-up visits as told by your health care provider. This is important. This includes any visits with any specialists, such as a physical therapist. Contact a health care provider if:  Your back pain interferes with your daily activities.  You have increasing pain in other parts of your body. Get help right away if:  You develop numbness, tingling, weakness, or problems with the use of your arms or legs.  You develop severe back pain that is not controlled with medicine.  You have a sudden change in bowel or bladder control.  You develop shortness of breath, dizziness, or you faint.  You develop nausea, vomiting, or sweating.  You have back pain that is a rhythmic, cramping pain similar to labor pains. Labor pain is usually 1-2 minutes apart, lasts for about 1 minute, and involves a bearing down feeling or pressure in your pelvis.  You have back pain and your water breaks or you have vaginal bleeding.  You have back pain or numbness that travels down your leg.  Your back pain developed after you fell.  You develop pain on one side of your back.  You see blood in your urine.  You develop skin blisters in the area of your back pain. This information is not intended to replace advice given to you   by your health care provider. Make sure you discuss any questions you have with your health care provider. Document Released: 07/15/2005 Document Revised: 09/12/2015 Document Reviewed: 12/19/2014 Elsevier Interactive Patient Education  2018 [employer]  

## 2016-12-12 LAB — CULTURE, OB URINE: Special Requests: NORMAL

## 2017-02-26 ENCOUNTER — Encounter (HOSPITAL_COMMUNITY): Payer: Self-pay | Admitting: *Deleted

## 2017-02-26 ENCOUNTER — Inpatient Hospital Stay (HOSPITAL_COMMUNITY)
Admission: AD | Admit: 2017-02-26 | Discharge: 2017-02-26 | Disposition: A | Discharge status: Home / Self Care | Payer: BLUE CROSS/BLUE SHIELD | Source: Ambulatory Visit | Attending: Obstetrics and Gynecology | Admitting: Obstetrics and Gynecology

## 2017-02-26 DIAGNOSIS — O4703 False labor before 37 completed weeks of gestation, third trimester: Secondary | ICD-10-CM | POA: Diagnosis not present

## 2017-02-26 DIAGNOSIS — O4700 False labor before 37 completed weeks of gestation, unspecified trimester: Secondary | ICD-10-CM | POA: Diagnosis present

## 2017-02-26 DIAGNOSIS — Z3A33 33 weeks gestation of pregnancy: Secondary | ICD-10-CM | POA: Diagnosis not present

## 2017-02-26 LAB — URINALYSIS, ROUTINE W REFLEX MICROSCOPIC
Bilirubin Urine: NEGATIVE
Glucose, UA: NEGATIVE mg/dL
Hgb urine dipstick: NEGATIVE
Ketones, ur: 80 mg/dL — AB
LEUKOCYTES UA: NEGATIVE
NITRITE: NEGATIVE
PROTEIN: NEGATIVE mg/dL
Specific Gravity, Urine: 1.025 (ref 1.005–1.030)
pH: 6 (ref 5.0–8.0)

## 2017-02-26 NOTE — MAU Note (Signed)
Pt reports she started having cramping off and on since this morning. Stronger than the braxton Hicks's she has been having. About q 10-15 min. Not as often now or as strong. Denies any vaginal bleeding or discharge and good fetal movement reported.

## 2017-02-26 NOTE — MAU Provider Note (Signed)
History     CSN: [id number]  Arrival date and time: 02/26/17 1502   First Provider Initiated Contact with Patient 02/26/17 1541      Chief Complaint  Patient presents with  . Contractions   HPI  Ms. [name] is a 21 y.o. G2P0010 at 2548w0d gestation presenting with complaints of UC's that started at 0800 this morning until about 1200. She states the UC's "were not like the [facility]" she is used to having. She called [facility]  And about 1000. She was instructed to take Tylenol and drink more water. She talked some pregnant women at work and became worried that something may be wrong, so she and her husband decided to come be checked out. She reports that her contractions have lessened since being here. She feels like the Tylenol and water helped. She denies VB or LOF. She reports good (+) FM all day today.  Past Medical History:  Diagnosis Date  . Allergy   . 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 Anemia   . Bronchitis    Fall 2013, prescribed inhaler, does not take inhaler now.  . Chest pain   . Headache   . Infection    UTI  . Ovarian cyst   . Palpitations     Past Surgical History:  Procedure Laterality Date  . OTHER SURGICAL HISTORY     splinter R foot req surgery, L foot glass required surgery, dates unknown  . WISDOM TOOTH EXTRACTION      Family History  Problem Relation Age of Onset  . Diabetes Mother   . Hypertension Mother   . Alcohol abuse Mother   . Appendicitis Brother   . Drug abuse Brother   . Cancer Maternal Aunt   . Cancer Paternal Uncle   . Diabetes Maternal Grandmother   . Diabetes Maternal Grandfather   . Hypertension Maternal Grandfather   . Cancer Paternal Grandmother        liver cancer    Social History   Tobacco Use  . Smoking status: Never Smoker  . Smokeless tobacco: Never Used  Substance Use Topics  . Alcohol use: No  . Drug use: No    Allergies:  Allergies  Allergen Reactions  .  Bee Venom Anaphylaxis  . Red Dye Anaphylaxis  . Wasp Venom Anaphylaxis  . Latex Rash    Medications Prior to Admission  Medication Sig Dispense Refill Last Dose  . acetaminophen (TYLENOL) 500 MG tablet Take 1,000 mg every 6 (six) hours as needed by mouth for moderate pain.   02/26/2017 at Unknown time  . Prenatal Vit-Fe Fumarate-FA (PRENATAL MULTIVITAMIN) TABS tablet Take 1 tablet daily at 12 noon by mouth.   02/26/2017 at Unknown time  . EPINEPHrine (EPI-PEN) 0.3 mg/0.3 mL DEVI tud (Patient taking differently: Inject 0.3 mg into the muscle once. tud) 1 Device 3 emergency  . metoCLOPramide (REGLAN) 10 MG tablet Take 1 tablet (10 mg total) by mouth every 8 (eight) hours as needed for nausea. (Patient not taking: Reported on 02/26/2017) 60 tablet 2 Not Taking at Unknown time  . oxyCODONE-acetaminophen (ROXICET) 5-325 MG tablet Take 1 tablet by mouth every 4 (four) hours as needed for severe pain. (Patient not taking: Reported on 02/26/2017) 10 tablet 0 Not Taking at Unknown time    Review of Systems  Constitutional: Negative.   HENT: Negative.   Eyes: Negative.   Respiratory: Negative.   Cardiovascular: Negative.  Gastrointestinal: Negative.   Endocrine: Negative.   Genitourinary:       Occ UC's  Musculoskeletal: Negative.   Skin: Negative.   Allergic/Immunologic: Negative.   Neurological: Negative.   Hematological: Negative.   Psychiatric/Behavioral: Negative.    Physical Exam   Blood pressure 102/62, pulse 82, temperature 98.4 F (36.9 C), temperature source Oral, resp. rate 18, height 5\' 5"  (1.651 m), weight 187 lb (84.8 kg), last menstrual period 07/10/2016.  Physical Exam  Nursing note and vitals reviewed. Constitutional: She is oriented to person, place, and time. She appears well-developed and well-nourished.  HENT:  Head: Normocephalic.  Eyes: Pupils are equal, round, and reactive to light.  Neck: Normal range of motion.  Cardiovascular: Normal rate.  Respiratory:  Effort normal.  GI: Soft.  Genitourinary:  Genitourinary Comments: 1cm/50%/high/soft  Musculoskeletal: Normal range of motion.  Neurological: She is alert and oriented to person, place, and time.  Skin: Skin is warm and dry.  Psychiatric: She has a normal mood and affect. Her behavior is normal. Judgment and thought content normal.    MAU Course  Procedures  MDM [facility] NST - FHR: 120 bpm / moderate variability / accels present / decels absent / TOCO: 4-5 UC's in 1 hr  *Consult with Dr. Normand Sloopillard @ 1615 - notified of patient's complaints, assessments, NST results, tx plan d/c home with reassurance and instructions on false labor - ok to d/c home, agrees with plan  Results for orders placed or performed during the hospital encounter of 02/26/17 (from the past 24 hour(s))  Urinalysis, Routine w reflex microscopic     Status: Abnormal   Collection Time: 02/26/17  3:10 PM  Result Value Ref Range   Color, Urine YELLOW YELLOW   APPearance CLEAR CLEAR   Specific Gravity, Urine 1.025 1.005 - 1.030   pH 6.0 5.0 - 8.0   Glucose, UA NEGATIVE NEGATIVE mg/dL   Hgb urine dipstick NEGATIVE NEGATIVE   Bilirubin Urine NEGATIVE NEGATIVE   Ketones, ur 80 (A) NEGATIVE mg/dL   Protein, ur NEGATIVE NEGATIVE mg/dL   Nitrite NEGATIVE NEGATIVE   Leukocytes, UA NEGATIVE NEGATIVE    Assessment and Plan  False labor before 37 completed weeks of gestation in third trimester - Reassurance given that UC's from today has not caused cervical change - Advised to continue drinking more water, taking Tylenol 1000 mg every 6 hrs prn UC's, soak in tub of warm water, and increased rest this weekend - Call OB office for >6 UC's/hr - Information [facility] on [facility]   Discharge home Keep scheduled OB appt Patient verbalized an understanding of the plan of care and agrees.   Raelyn Moraolitta Issai Werling, MSN, CNM 02/26/2017, 3:41 PM

## 2017-03-18 LAB — OB RESULTS CONSOLE GBS: GBS: NEGATIVE

## 2017-04-04 ENCOUNTER — Encounter (HOSPITAL_COMMUNITY): Payer: Self-pay | Admitting: *Deleted

## 2017-04-04 ENCOUNTER — Inpatient Hospital Stay (HOSPITAL_COMMUNITY)
Admission: AD | Admit: 2017-04-04 | Discharge: 2017-04-04 | Disposition: A | Discharge status: Home / Self Care | Payer: BLUE CROSS/BLUE SHIELD | Source: Ambulatory Visit | Attending: Obstetrics and Gynecology | Admitting: Obstetrics and Gynecology

## 2017-04-04 DIAGNOSIS — M5431 Sciatica, right side: Secondary | ICD-10-CM | POA: Diagnosis not present

## 2017-04-04 DIAGNOSIS — Z3A38 38 weeks gestation of pregnancy: Secondary | ICD-10-CM | POA: Diagnosis not present

## 2017-04-04 DIAGNOSIS — O9989 Other specified diseases and conditions complicating pregnancy, childbirth and the puerperium: Secondary | ICD-10-CM

## 2017-04-04 DIAGNOSIS — O26893 Other specified pregnancy related conditions, third trimester: Secondary | ICD-10-CM | POA: Diagnosis not present

## 2017-04-04 DIAGNOSIS — M79651 Pain in right thigh: Secondary | ICD-10-CM | POA: Diagnosis present

## 2017-04-04 NOTE — MAU Provider Note (Signed)
History     CSN: [id number]  Arrival date and time: 04/04/17 1519   First Provider Initiated Contact with Patient 04/04/17 1659      Chief Complaint  Patient presents with  . Leg Pain   HPI Ms. [name] is a 21 y.o. G2P0010 at 1743w2d who presents to MAU today with complaint of 4-5 days of right thigh pain. She states pain has been intermittent. It is sharp and shooting when it occurs. She has had more pain with weight bearing today causing difficulty bearing weight this afternoon. She denies swelling, redness or bruising. She states minimal pain now with rest, but 7/10 at the worst. She tried Tylenol with some relief, but has not taken any since Thursday. She denies vaginal bleeding, LOF, regular contractions today. She reports good fetal movement.   OB History    Gravida Para Term Preterm AB Living   2       1     SAB TAB Ectopic Multiple Live Births   1              Past Medical History:  Diagnosis Date  . Allergy   . 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 Anemia   . Bronchitis    Fall 2013, prescribed inhaler, does not take inhaler now.  . Chest pain   . Headache   . Infection    UTI  . Ovarian cyst   . Palpitations     Past Surgical History:  Procedure Laterality Date  . LAPAROSCOPIC APPENDECTOMY N/A 05/27/2012   Procedure: APPENDECTOMY LAPAROSCOPIC;  Surgeon: Judie PetitM. Leonia CoronaShuaib Farooqui, MD;  Location: [facility] OR;  Service: Pediatrics;  Laterality: N/A;  . OTHER SURGICAL HISTORY     splinter R foot req surgery, L foot glass required surgery, dates unknown  . WISDOM TOOTH EXTRACTION      Family History  Problem Relation Age of Onset  . Diabetes Mother   . Hypertension Mother   . Alcohol abuse Mother   . Appendicitis Brother   . Drug abuse Brother   . Cancer Maternal Aunt   . Cancer Paternal Uncle   . Diabetes Maternal Grandmother   . Diabetes Maternal Grandfather   . Hypertension Maternal Grandfather   . Cancer  Paternal Grandmother        liver cancer    Social History   Tobacco Use  . Smoking status: Never Smoker  . Smokeless tobacco: Never Used  Substance Use Topics  . Alcohol use: No  . Drug use: No    Allergies:  Allergies  Allergen Reactions  . Bee Venom Anaphylaxis  . Red Dye Anaphylaxis  . Wasp Venom Anaphylaxis  . Latex Rash    No medications prior to admission.    Review of Systems  Constitutional: Negative for fever.  Respiratory: Negative for shortness of breath.   Gastrointestinal: Negative for abdominal pain.  Genitourinary: Negative for dysuria, frequency, urgency, vaginal bleeding and vaginal discharge.  Musculoskeletal: Positive for arthralgias. Negative for joint swelling.  Neurological: Negative for numbness.   Physical Exam   Blood pressure 114/79, pulse 91, temperature 98.3 F (36.8 C), resp. rate 18, height 5' 5.5" (1.664 m), weight 197 lb (89.4 kg), last menstrual period 07/10/2016, SpO2 98 %, unknown if currently breastfeeding.  Physical Exam  Nursing note and vitals reviewed. Constitutional: She is oriented to person, place, and time. She appears well-developed and well-nourished. No distress.  HENT:  Head: Normocephalic  and atraumatic.  Cardiovascular: Normal rate and intact distal pulses.  Respiratory: Effort normal.  GI: Soft. She exhibits no distension and no mass. There is no tenderness. There is no rebound and no guarding.  Musculoskeletal: She exhibits no edema or tenderness.  + straight leg raise, right  Neurological: She is alert and oriented to person, place, and time. She has normal strength.  Reflex Scores:      Patellar reflexes are 2+ on the right side and 2+ on the left side. No clonus  Skin: Skin is warm and dry. No bruising and no ecchymosis noted. No erythema.  Psychiatric: She has a normal mood and affect.    Fetal Monitoring: Baseline: 120 bpm Variability: moderate Accelerations: 15 x 15 Decelerations:  none Contractions: few, irregular  MAU Course  Procedures None  MDM Discussed with Dr. Estanislado Pandyivard. Ok for discharge at this time with instructions for back and leg stretches. Follow-up as scheduled in the office this week.   Assessment and Plan  A: SIUP at 5140w2d Sciatica, right   P:  Discharge home Tylenol PRN for pain  Warning signs for worsening condition discussed Back and leg stretches included on AVS Advised symptoms may worsen with prolonged standing Encouraged use of abdominal binder Patient advised to follow-up with [facility] as scheduled this week for routine prenatal care Patient may return to MAU as needed or if her condition were to change or worsen   Vonzella NippleJulie Ilario Dhaliwal, PA-C 04/04/2017, 7:28 PM

## 2017-04-04 NOTE — MAU Note (Signed)
Pt reports intense leg cramps in her right leg today. Leg cramps have been happening off/on for 4-5 days. Denies shortness of breath.

## 2017-04-04 NOTE — MAU Note (Signed)
Urine sent to lab 

## 2017-04-04 NOTE — Discharge Instructions (Signed)
Back Exercises If you have pain in your back, do these exercises 2-3 times each day or as told by your doctor. When the pain goes away, do the exercises once each day, but repeat the steps more times for each exercise (do more repetitions). If you do not have pain in your back, do these exercises once each day or as told by your doctor. Exercises Single Knee to Chest  Do these steps 3-5 times in a row for each leg: 1. Lie on your back on a firm bed or the floor with your legs stretched out. 2. Bring one knee to your chest. 3. Hold your knee to your chest by grabbing your knee or thigh. 4. Pull on your knee until you feel a gentle stretch in your lower back. 5. Keep doing the stretch for 10-30 seconds. 6. Slowly let go of your leg and straighten it.  Pelvic Tilt  Do these steps 5-10 times in a row: 1. Lie on your back on a firm bed or the floor with your legs stretched out. 2. Bend your knees so they point up to the ceiling. Your feet should be flat on the floor. 3. Tighten your lower belly (abdomen) muscles to press your lower back against the floor. This will make your tailbone point up to the ceiling instead of pointing down to your feet or the floor. 4. Stay in this position for 5-10 seconds while you gently tighten your muscles and breathe evenly.  Cat-Cow  Do these steps until your lower back bends more easily: 1. Get on your hands and knees on a firm surface. Keep your hands under your shoulders, and keep your knees under your hips. You may put padding under your knees. 2. Let your head hang down, and make your tailbone point down to the floor so your lower back is round like the back of a cat. 3. Stay in this position for 5 seconds. 4. Slowly lift your head and make your tailbone point up to the ceiling so your back hangs low (sags) like the back of a cow. 5. Stay in this position for 5 seconds.  Press-Ups  Do these steps 5-10 times in a row: 1. Lie on your belly (face-down)  on the floor. 2. Place your hands near your head, about shoulder-width apart. 3. While you keep your back relaxed and keep your hips on the floor, slowly straighten your arms to raise the top half of your body and lift your shoulders. Do not use your back muscles. To make yourself more comfortable, you may change where you place your hands. 4. Stay in this position for 5 seconds. 5. Slowly return to lying flat on the floor.  Bridges  Do these steps 10 times in a row: 1. Lie on your back on a firm surface. 2. Bend your knees so they point up to the ceiling. Your feet should be flat on the floor. 3. Tighten your butt muscles and lift your butt off of the floor until your waist is almost as high as your knees. If you do not feel the muscles working in your butt and the back of your thighs, slide your feet 1-2 inches farther away from your butt. 4. Stay in this position for 3-5 seconds. 5. Slowly lower your butt to the floor, and let your butt muscles relax.  If this exercise is too easy, try doing it with your arms crossed over your chest. Belly Crunches  Do these steps 5-10 times in   a row: 1. Lie on your back on a firm bed or the floor with your legs stretched out. 2. Bend your knees so they point up to the ceiling. Your feet should be flat on the floor. 3. Cross your arms over your chest. 4. Tip your chin a little bit toward your chest but do not bend your neck. 5. Tighten your belly muscles and slowly raise your chest just enough to lift your shoulder blades a tiny bit off of the floor. 6. Slowly lower your chest and your head to the floor.  Back Lifts Do these steps 5-10 times in a row: 1. Lie on your belly (face-down) with your arms at your sides, and rest your forehead on the floor. 2. Tighten the muscles in your legs and your butt. 3. Slowly lift your chest off of the floor while you keep your hips on the floor. Keep the back of your head in line with the curve in your back. Look at  the floor while you do this. 4. Stay in this position for 3-5 seconds. 5. Slowly lower your chest and your face to the floor.  Contact a doctor if:  Your back pain gets a lot worse when you do an exercise.  Your back pain does not lessen 2 hours after you exercise. If you have any of these problems, stop doing the exercises. Do not do them again unless your doctor says it is okay. Get help right away if:  You have sudden, very bad back pain. If this happens, stop doing the exercises. Do not do them again unless your doctor says it is okay. This information is not intended to replace advice given to you by your health care provider. Make sure you discuss any questions you have with your health care provider. Document Released: 05/09/2010 Document Revised: 09/12/2015 Document Reviewed: 05/31/2014 Elsevier Interactive Patient Education  2018 [employer] Sciatica Sciatica is pain, numbness, weakness, or tingling along your sciatic nerve. The sciatic nerve starts in the lower back and goes down the back of each leg. Sciatica happens when this nerve is pinched or has pressure put on it. Sciatica usually goes away on its own or with treatment. Sometimes, sciatica may keep coming back (recur). Follow these instructions at home: Medicines  Take over-the-counter and prescription medicines only as told by your doctor.  Do not drive or use heavy machinery while taking prescription pain medicine. Managing pain  If directed, put ice on the affected area. ? Put ice in a plastic bag. ? Place a towel between your skin and the bag. ? Leave the ice on for 20 minutes, 2-3 times a day.  After icing, apply heat to the affected area before you exercise or as often as told by your doctor. Use the heat source that your doctor tells you to use, such as a moist heat pack or a heating pad. ? Place a towel between your skin and the heat source. ? Leave the heat on for 20-30 minutes. ? Remove the heat if your  skin turns bright red. This is especially important if you are unable to feel pain, heat, or cold. You may have a greater risk of getting burned. Activity  Return to your normal activities as told by your doctor. Ask your doctor what activities are safe for you. ? Avoid activities that make your sciatica worse.  Take short rests during the day. Rest in a lying or standing position. This is usually better than sitting to rest. ?   When you rest for a long time, do some physical activity or stretching between periods of rest. ? Avoid sitting for a long time without moving. Get up and move around at least one time each hour.  Exercise and stretch regularly, as told by your doctor.  Do not lift anything that is heavier than 10 lb (4.5 kg) while you have symptoms of sciatica. ? Avoid lifting heavy things even when you do not have symptoms. ? Avoid lifting heavy things over and over.  When you lift objects, always lift in a way that is safe for your body. To do this, you should: ? Bend your knees. ? Keep the object close to your body. ? Avoid twisting. General instructions  Use good posture. ? Avoid leaning forward when you are sitting. ? Avoid hunching over when you are standing.  Stay at a healthy weight.  Wear comfortable shoes that support your feet. Avoid wearing high heels.  Avoid sleeping on a mattress that is too soft or too hard. You might have less pain if you sleep on a mattress that is firm enough to support your back.  Keep all follow-up visits as told by your doctor. This is important. Contact a doctor if:  You have pain that: ? Wakes you up when you are sleeping. ? Gets worse when you lie down. ? Is worse than the pain you have had in the past. ? Lasts longer than 4 weeks.  You lose weight for without trying. Get help right away if:  You cannot control when you pee (urinate) or poop (have a bowel movement).  You have weakness in any of these areas and it gets  worse. ? Lower back. ? Lower belly (pelvis). ? Butt (buttocks). ? Legs.  You have redness or swelling of your back.  You have a burning feeling when you pee. This information is not intended to replace advice given to you by your health care provider. Make sure you discuss any questions you have with your health care provider. Document Released: 01/14/2008 Document Revised: 09/12/2015 Document Reviewed: 12/14/2014 Elsevier Interactive Patient Education  2018 [employer] Inc[employer]  

## 2017-04-13 ENCOUNTER — Encounter (HOSPITAL_COMMUNITY): Payer: Self-pay | Admitting: *Deleted

## 2017-04-13 ENCOUNTER — Inpatient Hospital Stay (HOSPITAL_COMMUNITY): Payer: BLUE CROSS/BLUE SHIELD | Admitting: Anesthesiology

## 2017-04-13 ENCOUNTER — Inpatient Hospital Stay (HOSPITAL_COMMUNITY)
Admission: AD | Admit: 2017-04-13 | Discharge: 2017-04-15 | DRG: 807 | Disposition: A | Discharge status: Home / Self Care | Payer: BLUE CROSS/BLUE SHIELD | Source: Ambulatory Visit | Attending: Obstetrics and Gynecology | Admitting: Obstetrics and Gynecology

## 2017-04-13 DIAGNOSIS — Z3A39 39 weeks gestation of pregnancy: Secondary | ICD-10-CM | POA: Diagnosis not present

## 2017-04-13 DIAGNOSIS — Z3483 Encounter for supervision of other normal pregnancy, third trimester: Secondary | ICD-10-CM | POA: Diagnosis present

## 2017-04-13 LAB — CBC
HCT: 38.7 % (ref 36.0–46.0)
Hemoglobin: 13.7 g/dL (ref 12.0–15.0)
MCH: 30.9 pg (ref 26.0–34.0)
MCHC: 35.4 g/dL (ref 30.0–36.0)
MCV: 87.4 fL (ref 78.0–100.0)
Platelets: 218 10*3/uL (ref 150–400)
RBC: 4.43 MIL/uL (ref 3.87–5.11)
RDW: 12.8 % (ref 11.5–15.5)
WBC: 13.4 10*3/uL — ABNORMAL HIGH (ref 4.0–10.5)

## 2017-04-13 LAB — TYPE AND SCREEN
ABO/RH(D): A POS
Antibody Screen: NEGATIVE

## 2017-04-13 MED ORDER — ONDANSETRON HCL 4 MG/2ML IJ SOLN: 4.0000 mg | INTRAMUSCULAR | Status: DC | PRN

## 2017-04-13 MED ORDER — LIDOCAINE HCL (PF) 1 % IJ SOLN
30.0000 mL | INTRAMUSCULAR | Status: DC | PRN
  Administered 2017-04-13: 30 mL via SUBCUTANEOUS
  Filled 2017-04-13: qty 30

## 2017-04-13 MED ORDER — LACTATED RINGERS IV SOLN
INTRAVENOUS | Status: DC
  Administered 2017-04-13 (×2): via INTRAVENOUS

## 2017-04-13 MED ORDER — ACETAMINOPHEN 325 MG PO TABS
650.0000 mg | ORAL_TABLET | ORAL | Status: DC | PRN
Start: 2017-04-13 — End: 2017-04-15
  Administered 2017-04-14: 650 mg via ORAL

## 2017-04-13 MED ORDER — DIBUCAINE 1 % RE OINT: 1.0000 "application " | TOPICAL_OINTMENT | RECTAL | Status: DC | PRN

## 2017-04-13 MED ORDER — LACTATED RINGERS IV SOLN
500.0000 mL | INTRAVENOUS | Status: DC | PRN
  Administered 2017-04-13: 500 mL via INTRAVENOUS

## 2017-04-13 MED ORDER — OXYCODONE-ACETAMINOPHEN 5-325 MG PO TABS: 2.0000 | ORAL_TABLET | ORAL | Status: DC | PRN

## 2017-04-13 MED ORDER — ONDANSETRON HCL 4 MG/2ML IJ SOLN: 4.0000 mg | Freq: Four times a day (QID) | INTRAMUSCULAR | Status: DC | PRN

## 2017-04-13 MED ORDER — OXYTOCIN 40 UNITS IN LACTATED RINGERS INFUSION - SIMPLE MED
2.5000 [IU]/h | INTRAVENOUS | Status: DC
  Administered 2017-04-13: 2.5 [IU]/h via INTRAVENOUS
  Filled 2017-04-13: qty 1000

## 2017-04-13 MED ORDER — SOD CITRATE-CITRIC ACID 500-334 MG/5ML PO SOLN: 30.0000 mL | ORAL | Status: DC | PRN

## 2017-04-13 MED ORDER — PHENYLEPHRINE 40 MCG/ML (10ML) SYRINGE FOR IV PUSH (FOR BLOOD PRESSURE SUPPORT)
80.0000 ug | PREFILLED_SYRINGE | INTRAVENOUS | Status: DC | PRN
  Filled 2017-04-13: qty 10
  Filled 2017-04-13: qty 5

## 2017-04-13 MED ORDER — ONDANSETRON HCL 4 MG PO TABS: 4.0000 mg | ORAL_TABLET | ORAL | Status: DC | PRN

## 2017-04-13 MED ORDER — PHENYLEPHRINE 40 MCG/ML (10ML) SYRINGE FOR IV PUSH (FOR BLOOD PRESSURE SUPPORT)
80.0000 ug | PREFILLED_SYRINGE | INTRAVENOUS | Status: DC | PRN
  Filled 2017-04-13: qty 5
  Filled 2017-04-13: qty 10

## 2017-04-13 MED ORDER — LACTATED RINGERS IV SOLN: 500.0000 mL | Freq: Once | INTRAVENOUS | Status: DC

## 2017-04-13 MED ORDER — WITCH HAZEL-GLYCERIN EX PADS: 1.0000 "application " | MEDICATED_PAD | CUTANEOUS | Status: DC | PRN

## 2017-04-13 MED ORDER — OXYCODONE-ACETAMINOPHEN 5-325 MG PO TABS: 1.0000 | ORAL_TABLET | ORAL | Status: DC | PRN

## 2017-04-13 MED ORDER — ACETAMINOPHEN 325 MG PO TABS
650.0000 mg | ORAL_TABLET | ORAL | Status: DC | PRN
  Filled 2017-04-13: qty 2

## 2017-04-13 MED ORDER — TETANUS-DIPHTH-ACELL PERTUSSIS 5-2.5-18.5 LF-MCG/0.5 IM SUSP: 0.5000 mL | Freq: Once | INTRAMUSCULAR | Status: DC

## 2017-04-13 MED ORDER — EPHEDRINE 5 MG/ML INJ
10.0000 mg | INTRAVENOUS | Status: DC | PRN
  Filled 2017-04-13: qty 2

## 2017-04-13 MED ORDER — LIDOCAINE HCL (PF) 1 % IJ SOLN
INTRAMUSCULAR | Status: DC | PRN
  Administered 2017-04-13 (×2): 6 mL via EPIDURAL

## 2017-04-13 MED ORDER — COCONUT OIL OIL
1.0000 "application " | TOPICAL_OIL | Status: DC | PRN
  Administered 2017-04-14: 1 via TOPICAL
  Filled 2017-04-13: qty 120

## 2017-04-13 MED ORDER — IBUPROFEN 600 MG PO TABS
600.0000 mg | ORAL_TABLET | Freq: Four times a day (QID) | ORAL | Status: DC
  Administered 2017-04-13 – 2017-04-15 (×6): 600 mg via ORAL
  Filled 2017-04-13 (×5): qty 1

## 2017-04-13 MED ORDER — FENTANYL 2.5 MCG/ML BUPIVACAINE 1/10 % EPIDURAL INFUSION (WH - ANES)
14.0000 mL/h | INTRAMUSCULAR | Status: DC | PRN
  Administered 2017-04-13: 14 mL/h via EPIDURAL
  Filled 2017-04-13: qty 100

## 2017-04-13 MED ORDER — SIMETHICONE 80 MG PO CHEW: 80.0000 mg | CHEWABLE_TABLET | ORAL | Status: DC | PRN

## 2017-04-13 MED ORDER — BUTORPHANOL TARTRATE 1 MG/ML IJ SOLN
1.0000 mg | INTRAMUSCULAR | Status: DC | PRN
  Administered 2017-04-13: 1 mg via INTRAVENOUS
  Filled 2017-04-13: qty 1

## 2017-04-13 MED ORDER — OXYTOCIN BOLUS FROM INFUSION
500.0000 mL | Freq: Once | INTRAVENOUS | Status: AC
  Administered 2017-04-13: 500 mL via INTRAVENOUS

## 2017-04-13 MED ORDER — DIPHENHYDRAMINE HCL 50 MG/ML IJ SOLN: 12.5000 mg | INTRAMUSCULAR | Status: DC | PRN

## 2017-04-13 MED ORDER — BENZOCAINE-MENTHOL 20-0.5 % EX AERO
1.0000 "application " | INHALATION_SPRAY | CUTANEOUS | Status: DC | PRN
  Administered 2017-04-13: 1 via TOPICAL
  Filled 2017-04-13: qty 56

## 2017-04-13 NOTE — H&P (Signed)
[name] is a 21 y.o. female G2 P0010 @ 39 4/7 weeks presented with c/o contractions.  Cervix exam changed in MAU to 2.5 to 3.5.  Pt was very uncomfortable and breathing through contractions. PNC has been complicated by UTI x 2.  TOC neg.  OB History    Gravida Para Term Preterm AB Living   2       1     SAB TAB Ectopic Multiple Live Births   1             Past Medical History:  Diagnosis Date  . Allergy   . 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 Anemia   . Bronchitis    Fall 2013, prescribed inhaler, does not take inhaler now.  . Chest pain   . Headache   . Infection    UTI  . Ovarian cyst   . Palpitations    Past Surgical History:  Procedure Laterality Date  . LAPAROSCOPIC APPENDECTOMY N/A 05/27/2012   Procedure: APPENDECTOMY LAPAROSCOPIC;  Surgeon: Judie PetitM. Leonia CoronaShuaib Farooqui, MD;  Location: [facility] OR;  Service: Pediatrics;  Laterality: N/A;  . OTHER SURGICAL HISTORY     splinter R foot req surgery, L foot glass required surgery, dates unknown  . WISDOM TOOTH EXTRACTION     Family History: family history includes Alcohol abuse in her mother; Appendicitis in her brother; Cancer in her maternal aunt, paternal grandmother, and paternal uncle; Diabetes in her maternal grandfather, maternal grandmother, and mother; Drug abuse in her brother; Hypertension in her maternal grandfather and mother. Social History:  reports that  has never smoked. she has never used smokeless tobacco. She reports that she does not drink alcohol or use drugs.     Maternal Diabetes: No Genetic Screening: Declined Maternal Ultrasounds/Referrals: Normal Fetal Ultrasounds or other Referrals:  None Maternal Substance Abuse:  No Significant Maternal Medications:  None Significant Maternal Lab Results:  Lab values include: Group B Strep negative Other Comments:  None  Review of Systems  Constitutional: Negative for fever.  Gastrointestinal: Positive for abdominal  pain.   Maternal Medical History:  Reason for admission: Contractions.   Contractions: Onset was 6-12 hours ago.   Frequency: regular.   Perceived severity is strong.    Fetal activity: Perceived fetal activity is normal.    Prenatal complications: no prenatal complications Prenatal Complications - Diabetes: none.    Dilation: 4.5 Effacement (%): 90 Station: 0 Exam by:: k fields, rn Blood pressure 116/71, pulse 76, temperature (!) 97.3 F (36.3 C), temperature source Oral, resp. rate 18, height 5' 5.5" (1.664 m), weight 92.1 kg (203 lb), last menstrual period 07/10/2016, SpO2 97 %, unknown if currently breastfeeding. Maternal Exam:  Uterine Assessment: Contraction strength is firm.  Contraction frequency is regular.   Abdomen: Patient reports no abdominal tenderness. Estimated fetal weight is 7.5-8 lbs.   Fetal presentation: vertex  Introitus: not evaluated.   Cervix: Cervical exam per RN.  Fetal Exam Fetal Monitor Review: Mode: fetoscope.   Variability: moderate (6-25 bpm).   Pattern: accelerations present.    Fetal State Assessment: Category I - tracings are normal.     Physical Exam  Constitutional: She is oriented to person, place, and time. She appears well-developed and well-nourished. She appears distressed.  HENT:  Head: Normocephalic and atraumatic.  Eyes: EOM are normal.  Neck: Normal range of motion.  Respiratory: Effort normal. No respiratory distress.  GI: There is no tenderness.  Musculoskeletal: Normal range of motion.  Neurological: She is alert and oriented to person, place, and time.  Skin: Skin is warm and dry.  Psychiatric: She has a normal mood and affect.    Prenatal labs: ABO, Rh: --/--/A POS (12/25 1420) Antibody: NEG (12/25 1420) Rubella:   RPR: Nonreactive (04/27 0000)  HBsAg: Negative (04/27 0000)  HIV: Non-reactive (04/27 0000)  GBS: Negative (11/29 0000)   Assessment/Plan: IUP @ 39 4/7 weeks. Active labor. Epidural in  place. Anticipate vaginal delivery. [name], [name] to assume care @ 7 pm has been updated on pt's status.   [name] 04/13/2017, 6:27 PM

## 2017-04-13 NOTE — Anesthesia Procedure Notes (Signed)
Epidural Patient location during procedure: OB Start time: 04/13/2017 6:04 PM End time: 04/13/2017 6:07 PM  Staffing Anesthesiologist: Leilani AbleHatchett, Milani Lowenstein, MD Performed: anesthesiologist   Preanesthetic Checklist Completed: patient identified, surgical consent, pre-op evaluation, timeout performed, IV checked, risks and benefits discussed and monitors and equipment checked  Epidural Patient position: sitting Prep: site prepped and draped and DuraPrep Patient monitoring: continuous pulse ox and blood pressure Approach: midline Location: L2-L3 Injection technique: LOR air  Needle:  Needle type: Tuohy  Needle gauge: 17 G Needle length: 9 cm and 9 Needle insertion depth: 6 cm Catheter type: closed end flexible Catheter size: 19 Gauge Catheter at skin depth: 11 cm Test dose: negative and Other  Assessment Sensory level: T9 Events: blood not aspirated, injection not painful, no injection resistance, negative IV test and no paresthesia

## 2017-04-13 NOTE — Anesthesia Preprocedure Evaluation (Signed)
Anesthesia Evaluation  Patient identified by MRN, date of birth, ID band Patient awake    Reviewed: Allergy & Precautions, H&P , NPO status , Patient's Chart, lab work & pertinent test results  Airway Mallampati: I  TM Distance: >3 FB Neck ROM: full    Dental no notable dental hx. (+) Teeth Intact   Pulmonary neg pulmonary ROS,    Pulmonary exam normal breath sounds clear to auscultation       Cardiovascular negative cardio ROS Normal cardiovascular exam Rhythm:regular Rate:Normal     Neuro/Psych negative psych ROS   GI/Hepatic negative GI ROS, Neg liver ROS,   Endo/Other  negative endocrine ROS  Renal/GU negative Renal ROS  negative genitourinary   Musculoskeletal negative musculoskeletal ROS (+)   Abdominal (+) + obese,   Peds negative pediatric ROS (+)  Hematology negative hematology ROS (+)   Anesthesia Other Findings   Reproductive/Obstetrics (+) Pregnancy                             Anesthesia Physical Anesthesia Plan  ASA: II  Anesthesia Plan: Epidural   Post-op Pain Management:    Induction:   PONV Risk Score and Plan:   Airway Management Planned:   Additional Equipment:   Intra-op Plan:   Post-operative Plan:   Informed Consent: I have reviewed the patients History and Physical, chart, labs and discussed the procedure including the risks, benefits and alternatives for the proposed anesthesia with the patient or authorized representative who has indicated his/her understanding and acceptance.     Plan Discussed with:   Anesthesia Plan Comments:         Anesthesia Quick Evaluation

## 2017-04-13 NOTE — Anesthesia Pain Management Evaluation Note (Signed)
  CRNA Pain Management Visit Note  Patient: Jacqueline BreamStephanie Gingerich, 21 y.o., female  "Hello [facility] am a [facility]. We have an anesthesia team available at all times to provide care throughout the hospital, including epidural management and anesthesia for C-section. I don't know your plan for the delivery whether it a natural birth, water birth, IV sedation, nitrous supplementation, doula or epidural, but we want to meet your pain goals."   1.Was your pain managed to your expectations on prior hospitalizations?   No prior hospitalizations  2.What is your expectation for pain management during this hospitalization?     Epidural  3.How can we help you reach that goal?   Record the patient's initial score and the patient's pain goal.   Pain: 8  Pain Goal: 9 [facility] wants you to be able to say your pain was always managed very well.  Laban EmperorMalinova,Cheyeanne Roadcap Hristova 04/13/2017

## 2017-04-13 NOTE — MAU Note (Signed)
Started contracting at 0100, now every 4-6 min. No bleeding or leaking.  Was 1 cm when last checked.

## 2017-04-13 NOTE — Lactation Note (Signed)
This note was copied from a baby's chart. Lactation Consultation Note  Patient Name: Jacqueline Jaynie BreamStephanie Forry AVWUJ'WToday's Date: 04/13/2017 Reason for consult: Initial assessment   P1, Baby in first hour of life. Reviewed hand expression with drops expressed. Assisted w/ latching baby in cross cradle to cradle on R breast. Intermittent sucks and swallows observed.  Nipples evert and compressible. Reviewed basics. Mom encouraged to feed baby 8-12 times/24 hours and with feeding cues.  Mom made aware of O/P services, breastfeeding support groups, community resources, and our phone # for post-discharge questions.     Maternal Data    Feeding Feeding Type: Breast Fed  LATCH Score Latch: Grasps breast easily, tongue down, lips flanged, rhythmical sucking.  Audible Swallowing: A few with stimulation  Type of Nipple: Everted at rest and after stimulation  Comfort (Breast/Nipple): Soft / non-tender  Hold (Positioning): Assistance needed to correctly position infant at breast and maintain latch.  LATCH Score: 8  Interventions Interventions: Breast feeding basics reviewed;Assisted with latch;Skin to skin;Hand express;Breast compression  Lactation Tools Discussed/Used     Consult Status Consult Status: Follow-up Date: 04/14/17 Follow-up type: In-patient    Dahlia ByesBerkelhammer, [facility] 04/13/2017, 8:42 PM

## 2017-04-13 NOTE — MAU Note (Signed)
Pt presents to MAU with complaints of contractions since around 1am. Denies any VB or LOF

## 2017-04-14 ENCOUNTER — Encounter (HOSPITAL_COMMUNITY): Payer: Self-pay | Admitting: General Practice

## 2017-04-14 LAB — CBC
HCT: 34.5 % — ABNORMAL LOW (ref 36.0–46.0)
Hemoglobin: 12 g/dL (ref 12.0–15.0)
MCH: 30.5 pg (ref 26.0–34.0)
MCHC: 34.8 g/dL (ref 30.0–36.0)
MCV: 87.6 fL (ref 78.0–100.0)
Platelets: 209 10*3/uL (ref 150–400)
RBC: 3.94 MIL/uL (ref 3.87–5.11)
RDW: 13 % (ref 11.5–15.5)
WBC: 16.2 10*3/uL — ABNORMAL HIGH (ref 4.0–10.5)

## 2017-04-14 NOTE — Anesthesia Postprocedure Evaluation (Signed)
Anesthesia Post Note  Patient: Jacqueline BreamStephanie Turrell  Procedure(s) Performed: AN AD HOC LABOR EPIDURAL     Patient location during evaluation: Mother Baby Anesthesia Type: Epidural Level of consciousness: awake and alert Pain management: pain level controlled Vital Signs Assessment: post-procedure vital signs reviewed and stable Respiratory status: spontaneous breathing, nonlabored ventilation and respiratory function stable Cardiovascular status: stable Postop Assessment: no headache, no backache and epidural receding Anesthetic complications: no    Last Vitals:  Vitals:   04/14/17 0301 04/14/17 0654  BP: (!) 99/58 108/66  Pulse: 70 64  Resp:  18  Temp: 37.1 C 37.1 C  SpO2:      Last Pain:  Vitals:   04/14/17 0654  TempSrc: Axillary  PainSc:    Pain Goal:                 Junious SilkGILBERT,Lexa Coronado

## 2017-04-14 NOTE — Progress Notes (Signed)
Mothers R nipple tender,pink with sm. clear blister. Plan is to rest nipple for 2 feedings and hand pump or hand express for stimulation if not painful. Coconut oil given and applied with instructions on frequency.

## 2017-04-14 NOTE — Progress Notes (Signed)
Subjective: Postpartum Day 1: Vaginal delivery,  2nd laceration Patient up ad lib, reports no syncope or dizziness. Feeding:  Breastfeeding Contraceptive plan:  undecided  Objective: Vital signs in last 24 hours: Temp:  [97.3 F (36.3 C)-99.7 F (37.6 C)] 98.8 F (37.1 C) (12/26 0654) Pulse Rate:  [64-106] 64 (12/26 0654) Resp:  [18-20] 18 (12/26 0654) BP: (99-137)/(43-98) 108/66 (12/26 0654) SpO2:  [97 %-100 %] 99 % (12/25 2300) Weight:  [88.7 kg (195 lb 8 oz)-92.1 kg (203 lb)] 92.1 kg (203 lb) (12/25 1451)  Physical Exam:  General: alert, cooperative and no distress Lochia: appropriate Uterine Fundus: firm Perineum: healing well DVT Evaluation: No evidence of DVT seen on physical exam. Negative Homan's sign.   CBC Latest Ref Rng & Units 04/14/2017 04/13/2017 09/07/2016  WBC 4.0 - 10.5 K/uL 16.2(H) 13.4(H) 10.3  Hemoglobin 12.0 - 15.0 g/dL 16.112.0 09.613.7 04.513.0  Hematocrit 36.0 - 46.0 % 34.5(L) 38.7 37.6  Platelets 150 - 400 K/uL 209 218 222     Assessment/Plan: Status post vaginal delivery day 1. Stable Continue current care. Breastfeeding and Lactation consult  Undecided about discharge    [name] Jean ProtheroCNM 04/14/2017, 8:04 AM

## 2017-04-14 NOTE — Progress Notes (Signed)
Office called to obtain Rubella status. Patient is Immune (index 1.21). Results were faxed and in patient's hard chart.

## 2017-04-14 NOTE — Lactation Note (Signed)
This note was copied from a baby's chart. Lactation Consultation Note  Patient Name: Girl Jaynie BreamStephanie Dorko WGNFA'OToday's Date: 04/14/2017 Reason for consult: Follow-up assessment  Follow up visit at 18 hours of age.  Mom reports good feedings, but does have a blister on tip on nipple.  Mom to call with next feeding for assist and LATCH assessment.  Baby asleep in visitors arms.  Mom reports hand expressing before, during and after feedings.   Mom to call at next feeding.    Maternal Data Has patient been taught Hand Expression?: Yes Does the patient have breastfeeding experience prior to this delivery?: No  Feeding Feeding Type: Breast Fed Length of feed: 25 min  LATCH Score                   Interventions Interventions: Breast feeding basics reviewed  Lactation Tools Discussed/Used     Consult Status Consult Status: Follow-up Date: 04/15/17 Follow-up type: In-patient    Franz DellJana Shoptaw 04/14/2017, 2:41 PM

## 2017-04-15 ENCOUNTER — Other Ambulatory Visit: Payer: Self-pay

## 2017-04-15 LAB — RPR: RPR: NONREACTIVE

## 2017-04-15 LAB — BIRTH TISSUE RECOVERY COLLECTION (PLACENTA DONATION)

## 2017-04-15 MED ORDER — IBUPROFEN 600 MG PO TABS: 600.0000 mg | ORAL_TABLET | Freq: Four times a day (QID) | ORAL | 0 refills | Status: DC

## 2017-04-15 NOTE — Lactation Note (Signed)
This note was copied from a baby's chart. Lactation Consultation Note  Patient Name: Jacqueline [name] ZOXWR'UToday's Date: 04/15/2017  Baby just completed a feeding and she is relaxed and content.  Reviewed discharge teaching including engorgement treatment.  Questions answered.  Lactation outpatient services reviewed and encouraged.   Maternal Data    Feeding Feeding Type: Breast Fed Length of feed: 35 min  LATCH Score                   Interventions    Lactation Tools Discussed/Used     Consult Status      [name], [name] 04/15/2017, 8:52 AM

## 2017-04-15 NOTE — Discharge Summary (Signed)
OB Discharge Summary     Patient Name: [name] DOB: 11-26-1995 MRN: [id number]  Date of admission: 04/13/2017 Delivering MD: Kenney Houseman   Date of discharge: 04/15/2017  Admitting diagnosis: 39WKS CTX Intrauterine pregnancy: [redacted]w[redacted]d     Secondary diagnosis:  Principal Problem:   SVD (spontaneous vaginal delivery) Active Problems:   Labor and delivery indication for care or intervention   Second-degree perineal laceration, with delivery  Additional problems: None     Discharge diagnosis: Term Pregnancy Delivered                                                                                                Post partum procedures:None  Augmentation: None  Complications: None  Hospital course:  Onset of Labor With Vaginal Delivery     21 y.o. yo G2P1011 at [redacted]w[redacted]d was admitted in Active Labor on 04/13/2017. Patient had an uncomplicated labor course as follows:  Membrane Rupture Time/Date: 4:50 PM ,04/13/2017   Intrapartum Procedures: Episiotomy: None [1]                                         Lacerations:  2nd degree [3];Vaginal [6]  Patient had a delivery of a Viable infant. 04/13/2017  Information for the patient's newborn:  Jennylee, Dellarocca Girl Sherrill [[id number]]  Delivery Method: Vag-Spont    Pateint had an uncomplicated postpartum course.  She is ambulating, tolerating a regular diet, passing flatus, and urinating well. Patient is discharged home in stable condition on 04/15/17.   Physical exam  Vitals:   04/13/17 2300 04/14/17 0301 04/14/17 0654 04/14/17 1923  BP: (!) 99/54 (!) 99/58 108/66 106/67  Pulse: 67 70 64 67  Resp:   18 18  Temp: 99.4 F (37.4 C) 98.8 F (37.1 C) 98.8 F (37.1 C) 98.6 F (37 C)  TempSrc: Oral Oral Axillary   SpO2: 99%     Weight:      Height:       General: alert, cooperative and no distress  Chest: HRRR, Lungs CTA Lochia: appropriate Uterine Fundus: firm Incision: N/A DVT Evaluation: No evidence of DVT seen on  physical exam. No significant calf/ankle edema. Labs: Lab Results  Component Value Date   WBC 16.2 (H) 04/14/2017   HGB 12.0 04/14/2017   HCT 34.5 (L) 04/14/2017   MCV 87.6 04/14/2017   PLT 209 04/14/2017   CMP Latest Ref Rng & Units 12/10/2016  Glucose 65 - 99 mg/dL 74  BUN 6 - 20 mg/dL 13  Creatinine 7.82 - 9.56 mg/dL 2.13  Sodium 086 - 578 mmol/L 136  Potassium 3.5 - 5.1 mmol/L 3.7  Chloride 101 - 111 mmol/L 105  CO2 22 - 32 mmol/L 24  Calcium 8.9 - 10.3 mg/dL 4.6(N)  Total Protein 6.5 - 8.1 g/dL -  Total Bilirubin 0.3 - 1.2 mg/dL -  Alkaline Phos 38 - 629 U/L -  AST 15 - 41 U/L -  ALT 14 - 54 U/L -    Discharge instruction: per After  Visit Summary and "Baby and Me Booklet".  Pain Management, Peri-Care, Breastfeeding, Who and When to call for postpartum complications. Information Sheet(s) given Contraception Choices, PPD and Baby Blues   After visit meds:  Allergies as of 04/15/2017      Reactions   Bee Venom Anaphylaxis   Red Dye Anaphylaxis   Wasp Venom Anaphylaxis   Latex Rash      Medication List    STOP taking these medications   acetaminophen 500 MG tablet Commonly known as:  TYLENOL     TAKE these medications   EPINEPHrine 0.3 mg/0.3 mL Devi Commonly known as:  EPI-PEN tud What changed:    how much to take  how to take this  when to take this  additional instructions   ibuprofen 600 MG tablet Commonly known as:  ADVIL,MOTRIN Take 1 tablet (600 mg total) by mouth every 6 (six) hours.   prenatal multivitamin Tabs tablet Take 1 tablet daily at 12 noon by mouth.       Diet: routine diet  Activity: Advance as tolerated. Pelvic rest for 6 weeks.   Outpatient follow up:6 weeks Follow up Appt:No future appointments. Follow up Visit:No Follow-up on file.  Postpartum contraception: Undecided  Newborn Data: Live born female  Birth Weight: 7 lb 8.5 oz (3415 g) APGAR: 9, 10  Newborn Delivery   Birth date/time:  04/13/2017  19:54:00 Delivery type:  Vaginal, Spontaneous     Baby Feeding: Breast Disposition:home with mother   04/15/2017 Cherre Robins, CNM

## 2017-04-15 NOTE — Discharge Instructions (Signed)
Postpartum Depression and Baby Blues The postpartum period begins right after the birth of a baby. During this time, there is often a great amount of joy and excitement. It is also a time of many changes in the life of the parents. Regardless of how many times a mother gives birth, each child brings new challenges and dynamics to the family. It is not unusual to have feelings of excitement along with confusing shifts in moods, emotions, and thoughts. All mothers are at risk of developing postpartum depression or the "baby blues." These mood changes can occur right after giving birth, or they may occur many months after giving birth. The baby blues or postpartum depression can be mild or severe. Additionally, postpartum depression can go away rather quickly, or it can be a long-term condition. What are the causes? Raised hormone levels and the rapid drop in those levels are thought to be a main cause of postpartum depression and the baby blues. A number of hormones change during and after pregnancy. Estrogen and progesterone usually decrease right after the delivery of your baby. The levels of thyroid hormone and various cortisol steroids also rapidly drop. Other factors that play a role in these mood changes include major life events and genetics. What increases the risk? If you have any of the following risks for the baby blues or postpartum depression, know what symptoms to watch out for during the postpartum period. Risk factors that may increase the likelihood of getting the baby blues or postpartum depression include:  Having a personal or family history of depression.  Having depression while being pregnant.  Having premenstrual mood issues or mood issues related to oral contraceptives.  Having a lot of life stress.  Having marital conflict.  Lacking a social support network.  Having a baby with special needs.  Having health problems, such as diabetes.  What are the signs or  symptoms? Symptoms of baby blues include:  Brief changes in mood, such as going from extreme happiness to sadness.  Decreased concentration.  Difficulty sleeping.  Crying spells, tearfulness.  Irritability.  Anxiety.  Symptoms of postpartum depression typically begin within the first month after giving birth. These symptoms include:  Difficulty sleeping or excessive sleepiness.  Marked weight loss.  Agitation.  Feelings of worthlessness.  Lack of interest in activity or food.  Postpartum psychosis is a very serious condition and can be dangerous. Fortunately, it is rare. Displaying any of the following symptoms is cause for immediate medical attention. Symptoms of postpartum psychosis include:  Hallucinations and delusions.  Bizarre or disorganized behavior.  Confusion or disorientation.  How is this diagnosed? A diagnosis is made by an evaluation of your symptoms. There are no medical or lab tests that lead to a diagnosis, but there are various questionnaires that a health care provider may use to identify those with the baby blues, postpartum depression, or psychosis. Often, a screening tool called the [place] Postnatal Depression Scale is used to diagnose depression in the postpartum period. How is this treated? The baby blues usually goes away on its own in 1-2 weeks. Social support is often all that is needed. You will be encouraged to get adequate sleep and rest. Occasionally, you may be given medicines to help you sleep. Postpartum depression requires treatment because it can last several months or longer if it is not treated. Treatment may include individual or group therapy, medicine, or both to address any social, physiological, and psychological factors that may play a role in the  depression. Regular exercise, a healthy diet, rest, and social support may also be strongly recommended. Postpartum psychosis is more serious and needs treatment right away.  Hospitalization is often needed. Follow these instructions at home:  Get as much rest as you can. Nap when the baby sleeps.  Exercise regularly. Some women find yoga and walking to be beneficial.  Eat a balanced and nourishing diet.  Do little things that you enjoy. Have a cup of tea, take a bubble bath, read your favorite magazine, or listen to your favorite music.  Avoid alcohol.  Ask for help with household chores, cooking, grocery shopping, or running errands as needed. Do not try to do everything.  Talk to people close to you about how you are feeling. Get support from your partner, family members, friends, or other new moms.  Try to stay positive in how you think. Think about the things you are grateful for.  Do not spend a lot of time alone.  Only take over-the-counter or prescription medicine as directed by your health care provider.  Keep all your postpartum appointments.  Let your health care provider know if you have any concerns. Contact a health care provider if: You are having a reaction to or problems with your medicine. Get help right away if:  You have suicidal feelings.  You think you may harm the baby or someone else. This information is not intended to replace advice given to you by your health care provider. Make sure you discuss any questions you have with your health care provider. Document Released: 01/09/2004 Document Revised: 09/12/2015 Document Reviewed: 01/16/2013 Elsevier Interactive Patient Education  2017 [employer] Contraception Choices Contraception, also called birth control, refers to methods or devices that prevent pregnancy. Hormonal methods Contraceptive implant A contraceptive implant is a thin, plastic tube that contains a hormone. It is inserted into the upper part of the arm. It can remain in place for up to 3 years. Progestin-only injections Progestin-only injections are injections of progestin, a synthetic form of the hormone  progesterone. They are given every 3 months by a health care provider. Birth control pills Birth control pills are pills that contain hormones that prevent pregnancy. They must be taken once a day, preferably at the same time each day. Birth control patch The birth control patch contains hormones that prevent pregnancy. It is placed on the skin and must be changed once a week for three weeks and removed on the fourth week. A prescription is needed to use this method of contraception. Vaginal ring A vaginal ring contains hormones that prevent pregnancy. It is placed in the vagina for three weeks and removed on the fourth week. After that, the process is repeated with a new ring. A prescription is needed to use this method of contraception. Emergency contraceptive Emergency contraceptives prevent pregnancy after unprotected sex. They come in pill form and can be taken up to 5 days after sex. They work best the sooner they are taken after having sex. Most emergency contraceptives are available without a prescription. This method should not be used as your only form of birth control. Barrier methods Female condom A female condom is a thin sheath that is worn over the penis during sex. Condoms keep sperm from going inside a woman's body. They can be used with a spermicide to increase their effectiveness. They should be disposed after a single use. Female condom A female condom is a soft, loose-fitting sheath that is put into the vagina before sex. The  condom keeps sperm from going inside a woman's body. They should be disposed after a single use. Diaphragm A diaphragm is a soft, dome-shaped barrier. It is inserted into the vagina before sex, along with a spermicide. The diaphragm blocks sperm from entering the uterus, and the spermicide kills sperm. A diaphragm should be left in the vagina for 6-8 hours after sex and removed within 24 hours. A diaphragm is prescribed and fitted by a health care provider. A  diaphragm should be replaced every 1-2 years, after giving birth, after gaining more than 15 lb (6.8 kg), and after pelvic surgery. Cervical cap A cervical cap is a round, soft latex or plastic cup that fits over the cervix. It is inserted into the vagina before sex, along with spermicide. It blocks sperm from entering the uterus. The cap should be left in place for 6-8 hours after sex and removed within 48 hours. A cervical cap must be prescribed and fitted by a health care provider. It should be replaced every 2 years. Sponge A sponge is a soft, circular piece of polyurethane foam with spermicide on it. The sponge helps block sperm from entering the uterus, and the spermicide kills sperm. To use it, you make it wet and then insert it into the vagina. It should be inserted before sex, left in for at least 6 hours after sex, and removed and thrown away within 30 hours. Spermicides Spermicides are chemicals that kill or block sperm from entering the cervix and uterus. They can come as a cream, jelly, suppository, foam, or tablet. A spermicide should be inserted into the vagina with an applicator at least 10-15 minutes before sex to allow time for it to work. The process must be repeated every time you have sex. Spermicides do not require a prescription. Intrauterine contraception Intrauterine device (IUD) An IUD is a T-shaped device that is put in a woman's uterus. There are two types:  Hormone IUD.This type contains progestin, a synthetic form of the hormone progesterone. This type can stay in place for 3-5 years.  Copper IUD.This type is wrapped in copper wire. It can stay in place for 10 years.  Permanent methods of contraception Female tubal ligation In this method, a woman's fallopian tubes are sealed, tied, or blocked during surgery to prevent eggs from traveling to the uterus. Hysteroscopic sterilization In this method, a small, flexible insert is placed into each fallopian tube. The inserts  cause scar tissue to form in the fallopian tubes and block them, so sperm cannot reach an egg. The procedure takes about 3 months to be effective. Another form of birth control must be used during those 3 months. Female sterilization This is a procedure to tie off the tubes that carry sperm (vasectomy). After the procedure, the man can still ejaculate fluid (semen). Natural planning methods Natural family planning In this method, a couple does not have sex on days when the woman could become pregnant. Calendar method This means keeping track of the length of each menstrual cycle, identifying the days when pregnancy can happen, and not having sex on those days. Ovulation method In this method, a couple avoids sex during ovulation. Symptothermal method This method involves not having sex during ovulation. The woman typically checks for ovulation by watching changes in her temperature and in the consistency of cervical mucus. Post-ovulation method In this method, a couple waits to have sex until after ovulation. Summary  Contraception, also called birth control, means methods or devices that prevent pregnancy.  Hormonal methods of contraception include implants, injections, pills, patches, vaginal rings, and emergency contraceptives.  Barrier methods of contraception can include female condoms, female condoms, diaphragms, cervical caps, sponges, and spermicides.  There are two types of IUDs (intrauterine devices). An IUD can be put in a woman's uterus to prevent pregnancy for 3-5 years.  Permanent sterilization can be done through a procedure for males, females, or both.  Natural family planning methods involve not having sex on days when the woman could become pregnant. This information is not intended to replace advice given to you by your health care provider. Make sure you discuss any questions you have with your health care provider. Document Released: 04/06/2005 Document Revised:  05/09/2016 Document Reviewed: 05/09/2016 Elsevier Interactive Patient Education  2018 [employer]

## 2018-06-10 IMAGING — US US ART/VEN ABD/PELV/SCROTUM DOPPLER LTD
1 series · 13 of 25 positions shown · non-contrast
Comparison: Pelvic ultrasound 05/27/2012

CLINICAL DATA: Pelvic pain

EXAM:
TRANSABDOMINAL AND TRANSVAGINAL ULTRASOUND OF PELVIS
DOPPLER ULTRASOUND OF OVARIES
TECHNIQUE: Both transabdominal and transvaginal ultrasound examinations of the
pelvis were performed. Transabdominal technique was performed for
global imaging of the pelvis including uterus, ovaries, adnexal
regions, and pelvic cul-de-sac.
It was necessary to proceed with endovaginal exam following the
transabdominal exam to visualize the uterus and ovaries. Color and
duplex Doppler ultrasound was utilized to evaluate blood flow to the
ovaries.

[Series 1: us art/ven abd/pelv/scrotum doppler ltd · 0.22mm/px · 13 of 84 slices shown]
[im 1/84]
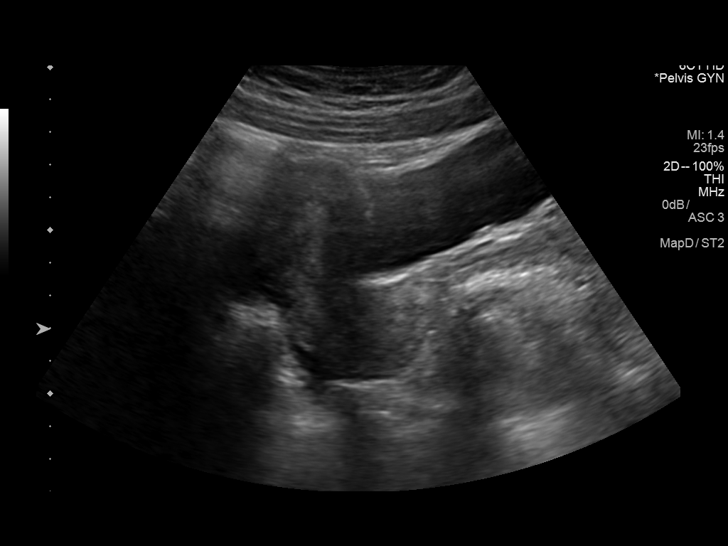
[im 7/84]
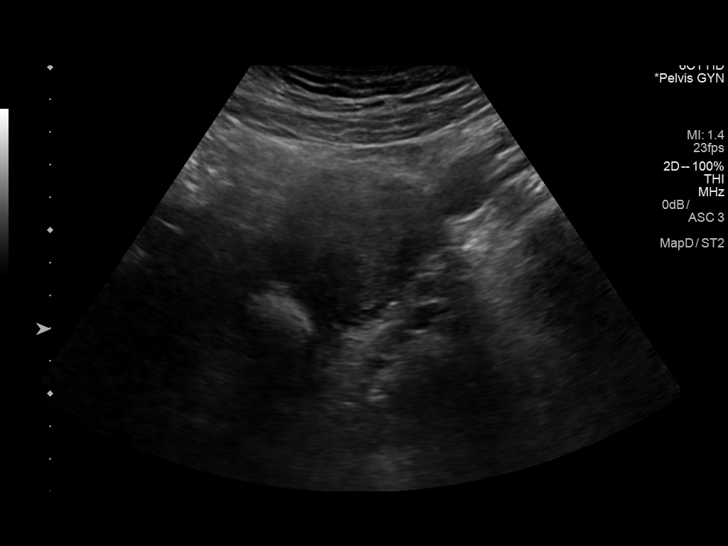
[im 14/84]
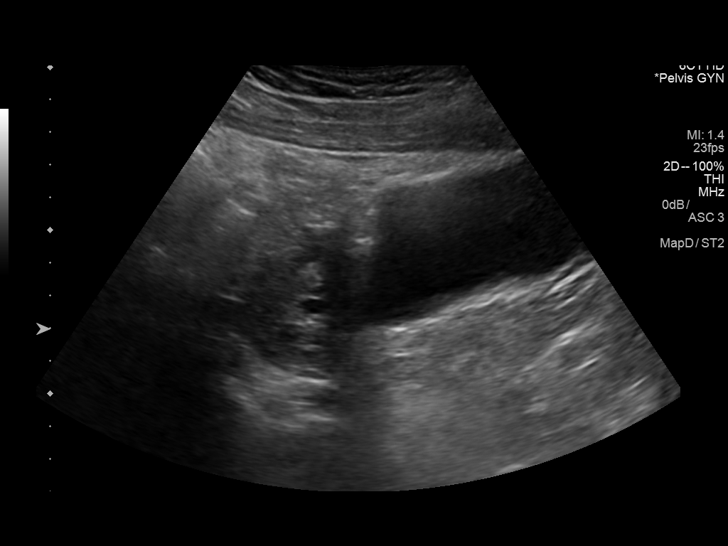
[im 21/84]
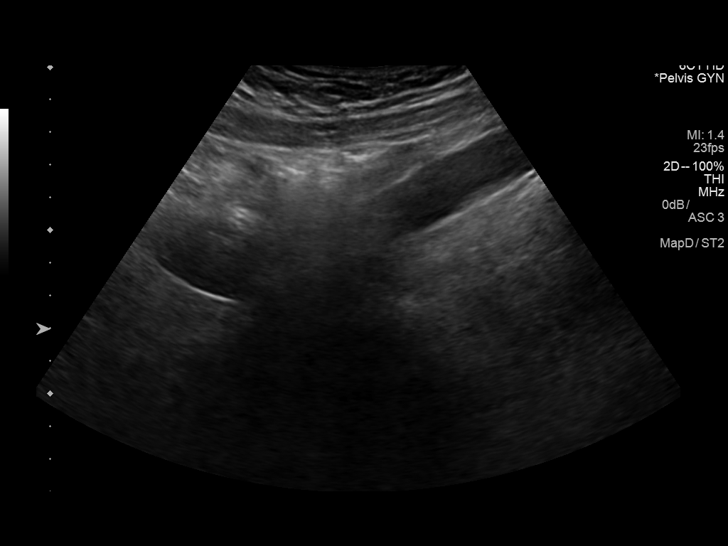
[im 28/84]
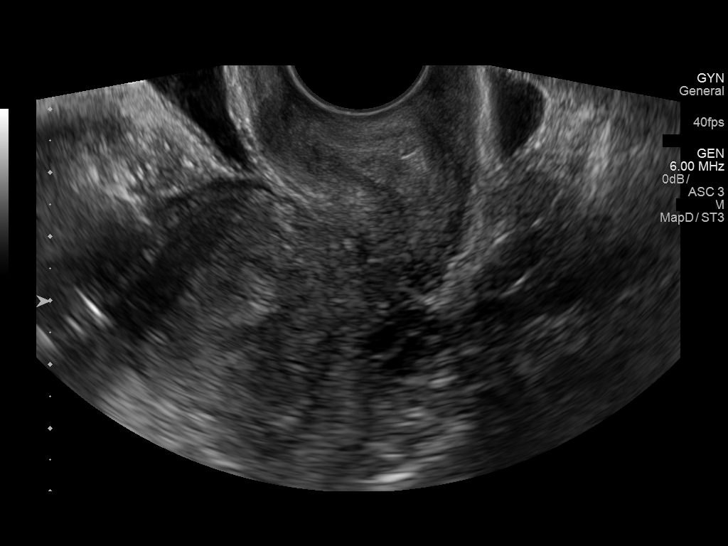
[im 35/84]
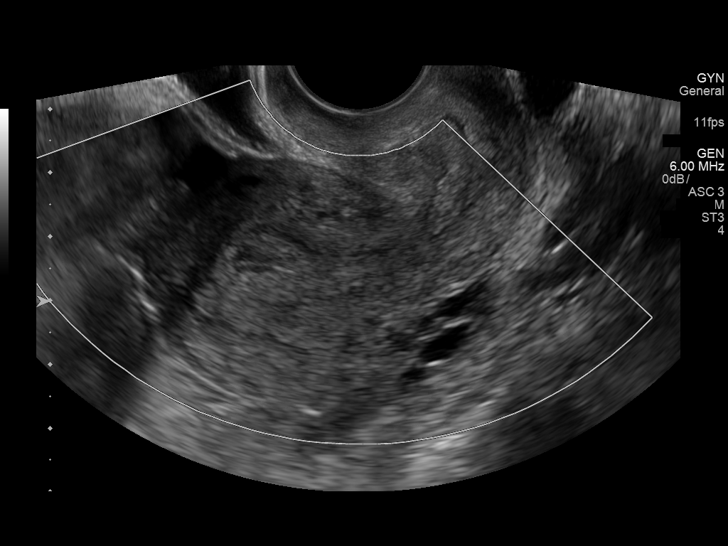
[im 42/84]
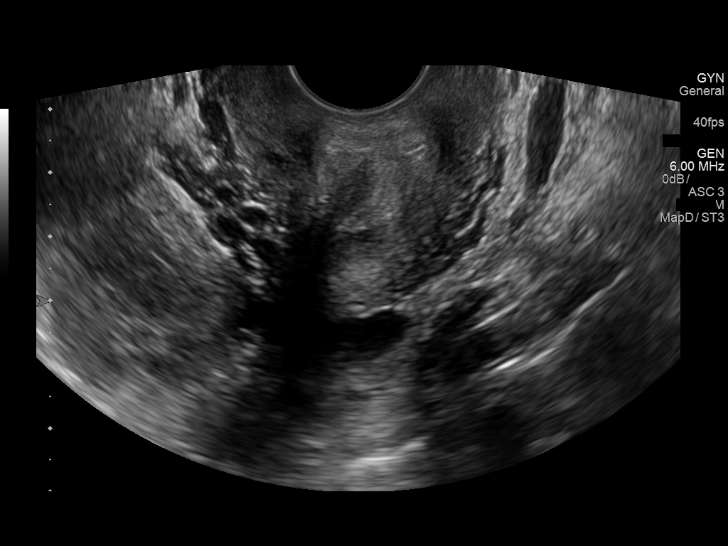
[im 49/84]
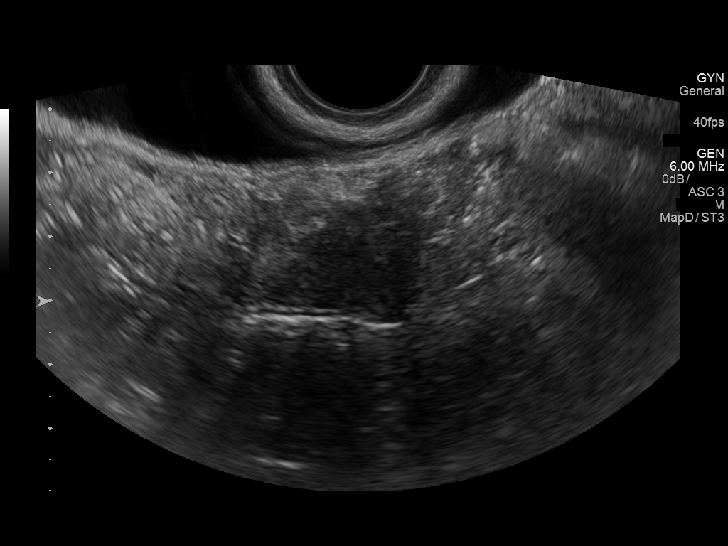
[im 56/84]
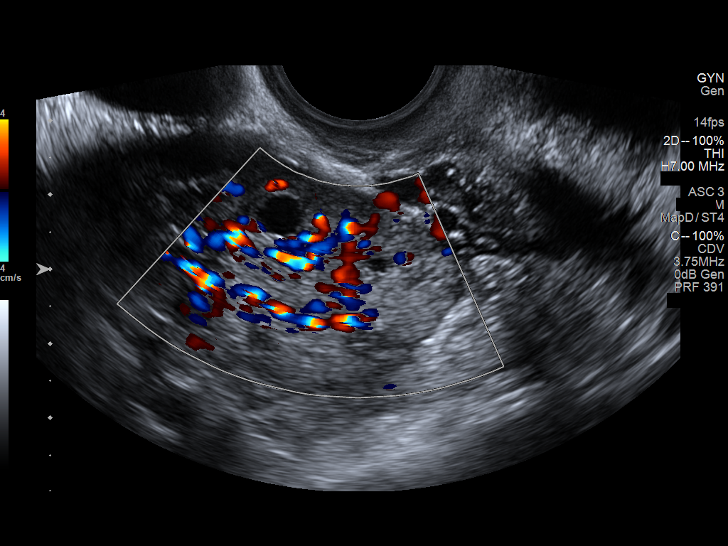
[im 63/84]
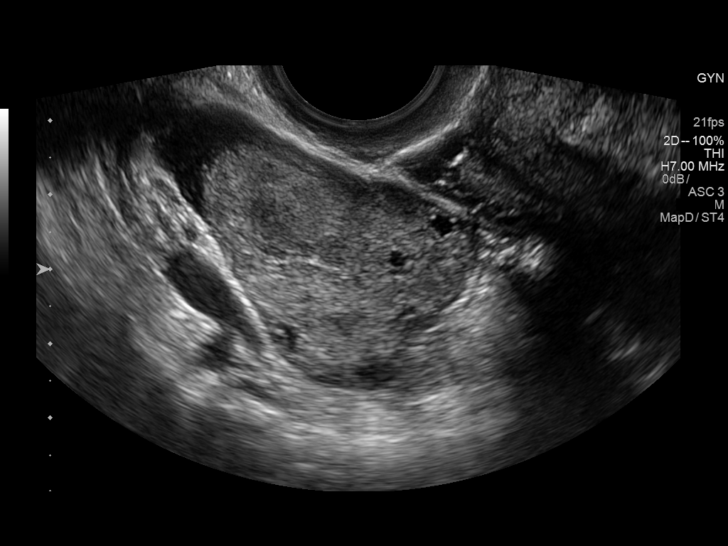
[im 70/84]
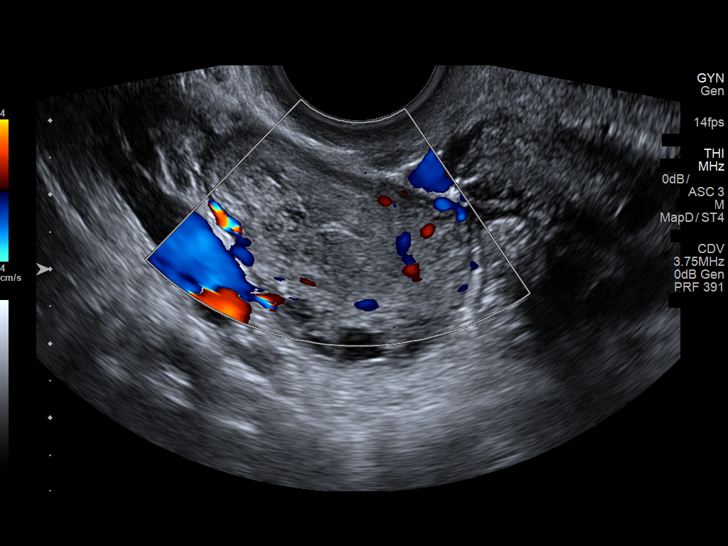
[im 77/84]
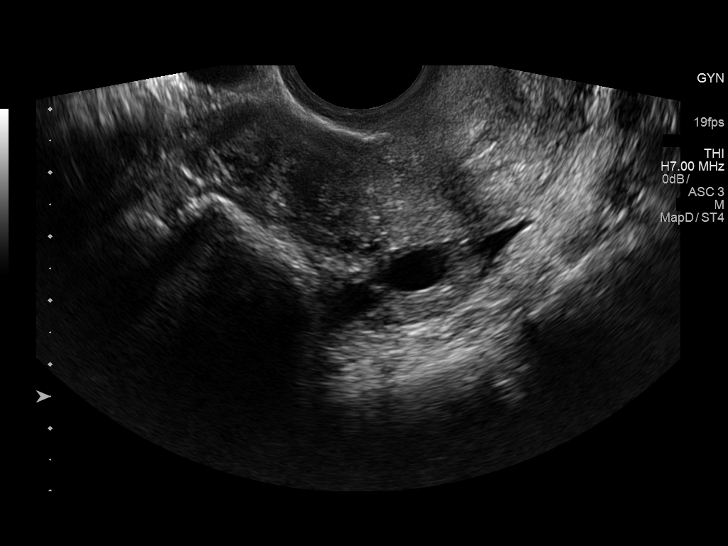
[im 84/84]
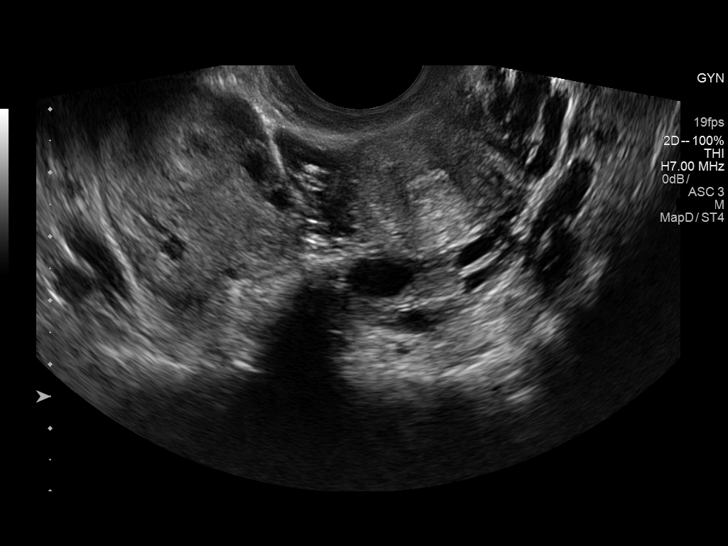

[13 of 25 positions shown; findings below may reference images not displayed]

FINDINGS: Uterus

Measurements: 7.2 x 3.1 x 4.6 cm. No fibroids or other mass
visualized.

Endometrium

Thickness: 12.4 mm.  No focal abnormality visualized.

Right ovary

Measurements: 3.8 x 2.7 x 3.5 cm. Multiple follicles. Hypoechoic
region in the right ovary likely a complex cyst measuring 3 cm..
Normal appearance/no adnexal mass.

Left ovary

Measurements: 3.8 x 1.3 x 3.4 cm. Normal appearance/no adnexal mass.

Pulsed Doppler evaluation of both ovaries demonstrates normal
low-resistance arterial and venous waveforms.

Other findings

Small amount of free fluid.
IMPRESSION: No acute abnormality in the pelvis.

## 2019-03-23 ENCOUNTER — Other Ambulatory Visit: Payer: Self-pay | Admitting: Family Medicine

## 2019-03-23 DIAGNOSIS — N6320 Unspecified lump in the left breast, unspecified quadrant: Secondary | ICD-10-CM

## 2019-03-23 DIAGNOSIS — N644 Mastodynia: Secondary | ICD-10-CM

## 2019-04-03 ENCOUNTER — Ambulatory Visit
Admission: RE | Admit: 2019-04-03 | Discharge: 2019-04-03 | Disposition: A | Discharge status: Home / Self Care | Payer: Managed Care, Other (non HMO) | Source: Ambulatory Visit | Attending: Family Medicine | Admitting: Family Medicine

## 2019-04-03 ENCOUNTER — Other Ambulatory Visit: Payer: Self-pay

## 2019-04-03 DIAGNOSIS — N644 Mastodynia: Secondary | ICD-10-CM

## 2019-04-03 DIAGNOSIS — N6320 Unspecified lump in the left breast, unspecified quadrant: Secondary | ICD-10-CM

## 2019-04-21 NOTE — L&D Delivery Note (Signed)
Delivery Note:   G3P1011 at [redacted]w[redacted]d  Admitting diagnosis: Normal labor [O80, Z37.9] Risks: History of hyperemesis with a PICC line until 28 weeks Onset of labor: 12/27/2019 @ 0300 IOL/Augmentation: AROM ROM: 12/27/2019 @ 1303, clear fluid  Complete dilation at 12/27/2019  1330 Onset of pushing at 1330 FHR second stage Cat 1  Analgesia /Anesthesia intrapartum:None  Pushing in lithotomy position with CNM and L&D staff support, FOB, Fredricka Bonine,  present for birth and supportive.  Delivery of a Live born female  Birth Weight: pending APGAR: 9, 9  Newborn Delivery   Birth date/time: 12/27/2019 13:36:00 Delivery type: Vaginal, Spontaneous      in cephalic presentation, position OA to ROA.  APGAR:1 min-9 , 5 min-9   Nuchal Cord: Yes x 1, body cord x 1 Cord double clamped after cessation of pulsation, cut by FOB.  Collection of cord blood for typing completed. Cord blood donation-None  Arterial cord blood sample-No    Placenta delivered-Spontaneous  with 3 vessels . Uterotonics: Pitocin IM Placenta to L&D. Uterine tone firm bleeding stable  None  laceration identified.  Episiotomy:None  Local analgesia: N/A  Repair: N/A Est. Blood Loss (mL): 50cc Complications: None  Mom to postpartum.  Baby August to Couplet care / Skin to Skin.  Delivery Report:  Review the Delivery Report for details.    Signed: Clancy Gourd, MSN 12/27/2019, 2:02 PM

## 2019-06-02 ENCOUNTER — Other Ambulatory Visit: Payer: Self-pay

## 2019-06-02 ENCOUNTER — Encounter (HOSPITAL_COMMUNITY): Payer: Self-pay | Admitting: Obstetrics

## 2019-06-02 ENCOUNTER — Inpatient Hospital Stay (HOSPITAL_COMMUNITY)
Admission: AD | Admit: 2019-06-02 | Discharge: 2019-06-02 | Disposition: A | Discharge status: Home / Self Care | Payer: Managed Care, Other (non HMO) | Attending: Obstetrics | Admitting: Obstetrics

## 2019-06-02 DIAGNOSIS — E860 Dehydration: Secondary | ICD-10-CM | POA: Insufficient documentation

## 2019-06-02 DIAGNOSIS — O99282 Endocrine, nutritional and metabolic diseases complicating pregnancy, second trimester: Secondary | ICD-10-CM | POA: Diagnosis not present

## 2019-06-02 DIAGNOSIS — Z800 Family history of malignant neoplasm of digestive organs: Secondary | ICD-10-CM | POA: Diagnosis not present

## 2019-06-02 DIAGNOSIS — O211 Hyperemesis gravidarum with metabolic disturbance: Secondary | ICD-10-CM | POA: Diagnosis present

## 2019-06-02 DIAGNOSIS — Z91038 Other insect allergy status: Secondary | ICD-10-CM | POA: Insufficient documentation

## 2019-06-02 DIAGNOSIS — O210 Mild hyperemesis gravidarum: Secondary | ICD-10-CM | POA: Insufficient documentation

## 2019-06-02 DIAGNOSIS — Z833 Family history of diabetes mellitus: Secondary | ICD-10-CM | POA: Insufficient documentation

## 2019-06-02 DIAGNOSIS — Z91040 Latex allergy status: Secondary | ICD-10-CM | POA: Insufficient documentation

## 2019-06-02 DIAGNOSIS — Z791 Long term (current) use of non-steroidal anti-inflammatories (NSAID): Secondary | ICD-10-CM | POA: Diagnosis not present

## 2019-06-02 DIAGNOSIS — Z91030 Bee allergy status: Secondary | ICD-10-CM | POA: Diagnosis not present

## 2019-06-02 DIAGNOSIS — Z3A01 Less than 8 weeks gestation of pregnancy: Secondary | ICD-10-CM | POA: Insufficient documentation

## 2019-06-02 DIAGNOSIS — Z91041 Radiographic dye allergy status: Secondary | ICD-10-CM | POA: Diagnosis not present

## 2019-06-02 DIAGNOSIS — Z809 Family history of malignant neoplasm, unspecified: Secondary | ICD-10-CM | POA: Insufficient documentation

## 2019-06-02 DIAGNOSIS — R1110 Vomiting, unspecified: Secondary | ICD-10-CM | POA: Diagnosis present

## 2019-06-02 DIAGNOSIS — Z887 Allergy status to serum and vaccine status: Secondary | ICD-10-CM | POA: Insufficient documentation

## 2019-06-02 LAB — URINALYSIS, ROUTINE W REFLEX MICROSCOPIC
Bilirubin Urine: NEGATIVE
Glucose, UA: NEGATIVE mg/dL
Hgb urine dipstick: NEGATIVE
Ketones, ur: 80 mg/dL — AB
Nitrite: NEGATIVE
Protein, ur: NEGATIVE mg/dL
Specific Gravity, Urine: 1.03 (ref 1.005–1.030)
pH: 5 (ref 5.0–8.0)

## 2019-06-02 LAB — POCT PREGNANCY, URINE: Preg Test, Ur: POSITIVE — AB

## 2019-06-02 MED ORDER — PROMETHAZINE HCL 25 MG/ML IJ SOLN
25.0000 mg | Freq: Once | INTRAMUSCULAR | Status: AC
  Administered 2019-06-02: 09:00:00 25 mg via INTRAVENOUS
  Filled 2019-06-02: qty 1

## 2019-06-02 MED ORDER — FAMOTIDINE IN NACL 20-0.9 MG/50ML-% IV SOLN
20.0000 mg | Freq: Once | INTRAVENOUS | Status: AC
  Administered 2019-06-02: 10:00:00 20 mg via INTRAVENOUS
  Filled 2019-06-02: qty 50

## 2019-06-02 MED ORDER — M.V.I. ADULT IV INJ
Freq: Once | INTRAVENOUS | Status: AC
  Filled 2019-06-02: qty 1000

## 2019-06-02 NOTE — MAU Provider Note (Signed)
History     CSN: [id number]  Arrival date and time: 06/02/19 0725   First Provider Initiated Contact with Patient 06/02/19 [phone]      Chief Complaint  Patient presents with  . Nausea  . Emesis   HPI  Ms.  [name] is a 24 y.o. year old G69P1011 female at [redacted]w[redacted]d weeks gestation who presents to MAU reporting she is unable to keep anything down consistently, vomiting 7-8 times in 24 hrs, not able to take medications Rx'd for N/V, and losing weight. She was seen by Dr. Pamala Hurry at [facility] on Wednesday 05/31/19 and told she has Hyperemesis Gravidarum. She has been Rx'd Zofran 8 mg every 12 hrs, Phenergan pills and/or suppositories every 4 hrs, if still nauseated (husband just picked up suppositories Rx), Diclegis (could not afford), Vitamin B6 every 6 hrs, and Unisom hs. She feels weak since she has not even been able to hold down water.   Past Medical History:  Diagnosis Date  . Allergy   . 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 Anemia   . Bronchitis    Fall 2013, prescribed inhaler, does not take inhaler now.  . Chest pain   . Headache   . Infection    UTI  . Ovarian cyst   . Palpitations     Past Surgical History:  Procedure Laterality Date  . LAPAROSCOPIC APPENDECTOMY N/A 05/27/2012   Procedure: APPENDECTOMY LAPAROSCOPIC;  Surgeon: Jerilynn Mages. Gerald Stabs, MD;  Location: [facility];  Service: Pediatrics;  Laterality: N/A;  . OTHER SURGICAL HISTORY     splinter R foot req surgery, L foot glass required surgery, dates unknown  . WISDOM TOOTH EXTRACTION      Family History  Problem Relation Age of Onset  . Diabetes Mother   . Hypertension Mother   . Alcohol abuse Mother   . Appendicitis Brother   . Drug abuse Brother   . Cancer Maternal Aunt   . Cancer Paternal Uncle   . Diabetes Maternal Grandmother   . Diabetes Maternal Grandfather   . Hypertension Maternal Grandfather   . Cancer Paternal Grandmother        liver cancer    Social  History   Tobacco Use  . Smoking status: Never Smoker  . Smokeless tobacco: Never Used  Substance Use Topics  . Alcohol use: No  . Drug use: No    Allergies:  Allergies  Allergen Reactions  . Bee Venom Anaphylaxis  . Red Dye Anaphylaxis  . Wasp Venom Anaphylaxis  . Tamiflu [Oseltamivir] Hives  . Latex Rash    Medications Prior to Admission  Medication Sig Dispense Refill Last Dose  . Prenatal Vit-Fe Fumarate-FA (PRENATAL MULTIVITAMIN) TABS tablet Take 1 tablet daily at 12 noon by mouth.   06/01/2019 at Unknown time  . EPINEPHrine (EPI-PEN) 0.3 mg/0.3 mL DEVI tud (Patient taking differently: Inject 0.3 mg into the muscle once. tud) 1 Device 3 More than a month at Unknown time  . ibuprofen (ADVIL,MOTRIN) 600 MG tablet Take 1 tablet (600 mg total) by mouth every 6 (six) hours. 30 tablet 0     Review of Systems  Constitutional: Positive for appetite change and unexpected weight change.  HENT: Negative.   Eyes: Negative.   Respiratory: Negative.   Cardiovascular: Negative.   Gastrointestinal: Positive for nausea and vomiting.  Endocrine: Negative.   Genitourinary: Negative.   Musculoskeletal: Negative.   Skin: Negative.   Allergic/Immunologic: Negative.  Neurological: Positive for weakness.  Hematological: Negative.   Psychiatric/Behavioral: Negative.    Physical Exam   Blood pressure 135/79, pulse (!) 102, temperature 98.4 F (36.9 C), temperature source Oral, resp. rate 20, height 5\' 6"  (1.676 m), weight 75.2 kg, last menstrual period 04/08/2019.  Physical Exam  Nursing note and vitals reviewed. Constitutional: She is oriented to person, place, and time. She appears well-developed. She has a sickly appearance.  HENT:  Head: Normocephalic and atraumatic.  Eyes: Pupils are equal, round, and reactive to light.  Cardiovascular: Normal rate.  Respiratory: Effort normal.  GI: Soft.  Genitourinary:    Genitourinary Comments: Not indicated   Musculoskeletal:         General: Normal range of motion.     Cervical back: Normal range of motion.  Neurological: She is alert and oriented to person, place, and time.  Skin: Skin is warm and dry.  Psychiatric: She has a normal mood and affect. Her behavior is normal. Judgment and thought content normal.    MAU Course  Procedures  MDM CCUA UPT IVFs: Phenergan 25 mg in LR 1000 ml @ 999 ml/hr; followed by MVI in LR 1000 ml @ 999 ml/hr -- no nausea/vomiting Pepcid 20 mg IVPB PO Challenge -- patient tolerated well "drinking the Sprite was like heaven!"  Results for orders placed or performed during the hospital encounter of 06/02/19 (from the past 24 hour(s))  Pregnancy, urine POC     Status: Abnormal   Collection Time: 06/02/19  7:42 AM  Result Value Ref Range   Preg Test, Ur POSITIVE (A) NEGATIVE  Urinalysis, Routine w reflex microscopic     Status: Abnormal   Collection Time: 06/02/19  7:46 AM  Result Value Ref Range   Color, Urine AMBER (A) YELLOW   APPearance CLOUDY (A) CLEAR   Specific Gravity, Urine 1.030 1.005 - 1.030   pH 5.0 5.0 - 8.0   Glucose, UA NEGATIVE NEGATIVE mg/dL   Hgb urine dipstick NEGATIVE NEGATIVE   Bilirubin Urine NEGATIVE NEGATIVE   Ketones, ur 80 (A) NEGATIVE mg/dL   Protein, ur NEGATIVE NEGATIVE mg/dL   Nitrite NEGATIVE NEGATIVE   Leukocytes,Ua MODERATE (A) NEGATIVE   RBC / HPF 6-10 0 - 5 RBC/hpf   WBC, UA 11-20 0 - 5 WBC/hpf   Bacteria, UA MANY (A) NONE SEEN   Squamous Epithelial / LPF 6-10 0 - 5   Mucus PRESENT     Assessment and Plan  Hyperemesis gravidarum before end of [redacted] week gestation with dehydration - Plan: Discharge patient - Advised to continue all medications for N/V as previously prescribed - Advised that can use Phenergan tablets or suppositories vaginally for absorption that bypasses the GI system - Information provided on HG - Keep scheduled appt with Dr. 07/31/19 in 3 wks - Patient verbalized an understanding of the plan of care and agrees.     Ernestina Penna, MSN, CNM 06/02/2019, 8:15 AM

## 2019-06-02 NOTE — MAU Note (Signed)
Presents with c/o N&V, unable to keep anything down since yesterday.  Reports vomited 7-8 times in 24 hours.   LMP 04/08/19.  Has been seen @ [facility].

## 2019-06-02 NOTE — Discharge Instructions (Signed)
Hyperemesis Gravidarum Hyperemesis gravidarum is a severe form of nausea and vomiting that happens during pregnancy. Hyperemesis is worse than morning sickness. It may cause you to have nausea or vomiting all day for many days. It may keep you from eating and drinking enough food and liquids, which can lead to dehydration, malnutrition, and weight loss. Hyperemesis usually occurs during the first half (the first 20 weeks) of pregnancy. It often goes away once a woman is in her second half of pregnancy. However, sometimes hyperemesis continues through an entire pregnancy. What are the causes? The cause of this condition is not known. It may be related to changes in chemicals (hormones) in the body during pregnancy, such as the high level of pregnancy hormone (human chorionic gonadotropin) or the increase in the female sex hormone (estrogen). What are the signs or symptoms? Symptoms of this condition include:  Nausea that does not go away.  Vomiting that does not allow you to keep any food down.  Weight loss.  Body fluid loss (dehydration).  Having no desire to eat, or not liking food that you have previously enjoyed. How is this diagnosed? This condition may be diagnosed based on:  A physical exam.  Your medical history.  Your symptoms.  Blood tests.  Urine tests. How is this treated? This condition is managed by controlling symptoms. This may include:  Following an eating plan. This can help lessen nausea and vomiting.  Taking prescription medicines. An eating plan and medicines are often used together to help control symptoms. If medicines do not help relieve nausea and vomiting, you may need to receive fluids through an IV at the hospital. Follow these instructions at home: Eating and drinking   Avoid the following: ? Drinking fluids with meals. Try not to drink anything during the 30 minutes before and after your meals. ? Drinking more than 1 cup of fluid at a  time. ? Eating foods that trigger your symptoms. These may include spicy foods, coffee, high-fat foods, very sweet foods, and acidic foods. ? Skipping meals. Nausea can be more intense on an empty stomach. If you cannot tolerate food, do not force it. Try sucking on ice chips or other frozen items and make up for missed calories later. ? Lying down within 2 hours after eating. ? Being exposed to environmental triggers. These may include food smells, smoky rooms, closed spaces, rooms with strong smells, warm or humid places, overly loud and noisy rooms, and rooms with motion or flickering lights. Try eating meals in a well-ventilated area that is free of strong smells. ? Quick and sudden changes in your movement. ? Taking iron pills and multivitamins that contain iron. If you take prescription iron pills, do not stop taking them unless your health care provider approves. ? Preparing food. The smell of food can spoil your appetite or trigger nausea.  To help relieve your symptoms: ? Listen to your body. Everyone is different and has different preferences. Find what works best for you. ? Eat and drink slowly. ? Eat 5-6 small meals daily instead of 3 large meals. Eating small meals and snacks can help you avoid an empty stomach. ? In the morning, before getting out of bed, eat a couple of crackers to avoid moving around on an empty stomach. ? Try eating starchy foods as these are usually tolerated well. Examples include cereal, toast, bread, potatoes, pasta, rice, and pretzels. ? Include at least 1 serving of protein with your meals and snacks. Protein options include   lean meats, poultry, seafood, beans, nuts, nut butters, eggs, cheese, and yogurt. ? Try eating a protein-rich snack before bed. Examples of a protein-rick snack include cheese and crackers or a peanut butter sandwich made with 1 slice of whole-wheat bread and 1 tsp (5 g) of peanut butter. ? Eat or suck on things that have ginger in them.  It may help relieve nausea. Add  tsp ground ginger to hot tea or choose ginger tea. ? Try drinking 100% fruit juice or an electrolyte drink. An electrolyte drink contains sodium, potassium, and chloride. ? Drink fluids that are cold, clear, and carbonated or sour. Examples include lemonade, ginger ale, lemon-lime soda, ice water, and sparkling water. ? Brush your teeth or use a mouth rinse after meals. ? Talk with your health care provider about starting a supplement of vitamin B6. General instructions  Take over-the-counter and prescription medicines only as told by your health care provider.  Follow instructions from your health care provider about eating or drinking restrictions.  Continue to take your prenatal vitamins as told by your health care provider. If you are having trouble taking your prenatal vitamins, talk with your health care provider about different options.  Keep all follow-up and pre-birth (prenatal) visits as told by your health care provider. This is important. Contact a health care provider if:  You have pain in your abdomen.  You have a severe headache.  You have vision problems.  You are losing weight.  You feel weak or dizzy. Get help right away if:  You cannot drink fluids without vomiting.  You vomit blood.  You have constant nausea and vomiting.  You are very weak.  You faint.  You have a fever and your symptoms suddenly get worse. Summary  Hyperemesis gravidarum is a severe form of nausea and vomiting that happens during pregnancy.  Making some changes to your eating habits may help relieve nausea and vomiting.  This condition may be managed with medicine.  If medicines do not help relieve nausea and vomiting, you may need to receive fluids through an IV at the hospital. This information is not intended to replace advice given to you by your health care provider. Make sure you discuss any questions you have with your health care  provider. Document Revised: 04/26/2017 Document Reviewed: 12/04/2015 Elsevier Patient Education  2020 [employer]  

## 2019-06-22 ENCOUNTER — Ambulatory Visit (HOSPITAL_COMMUNITY)
Admission: RE | Admit: 2019-06-22 | Discharge: 2019-06-22 | Disposition: A | Discharge status: Home / Self Care | Payer: Managed Care, Other (non HMO) | Source: Ambulatory Visit | Attending: Obstetrics | Admitting: Obstetrics

## 2019-06-22 ENCOUNTER — Other Ambulatory Visit (HOSPITAL_COMMUNITY): Payer: Self-pay | Admitting: Obstetrics

## 2019-06-22 ENCOUNTER — Other Ambulatory Visit: Payer: Self-pay

## 2019-06-22 DIAGNOSIS — Z3A00 Weeks of gestation of pregnancy not specified: Secondary | ICD-10-CM | POA: Diagnosis not present

## 2019-06-22 DIAGNOSIS — O210 Mild hyperemesis gravidarum: Secondary | ICD-10-CM | POA: Insufficient documentation

## 2019-06-22 MED ORDER — HEPARIN SOD (PORK) LOCK FLUSH 100 UNIT/ML IV SOLN
INTRAVENOUS | Status: AC
  Filled 2019-06-22: qty 5

## 2019-06-22 MED ORDER — LIDOCAINE HCL 1 % IJ SOLN
INTRAMUSCULAR | Status: AC
  Filled 2019-06-22: qty 20

## 2019-06-22 MED ORDER — HEPARIN SOD (PORK) LOCK FLUSH 100 UNIT/ML IV SOLN
INTRAVENOUS | Status: DC | PRN
  Administered 2019-06-22: 500 [IU] via INTRAVENOUS

## 2019-06-22 MED ORDER — LIDOCAINE HCL 1 % IJ SOLN
INTRAMUSCULAR | Status: DC | PRN
  Administered 2019-06-22: 10 mL

## 2019-06-22 NOTE — Procedures (Signed)
Interventional Radiology Procedure Note  Procedure: Image guided PICC. Small basilic was accessed.  42cm PICC.  Findings: Patient has small veins.  Right cephalic absent. .  Complications: None Recommendations:  - Ok to use - Do not submerge - Routine line care   Signed,  Yvone Neu. Loreta Ave, DO

## 2019-12-27 ENCOUNTER — Encounter (HOSPITAL_COMMUNITY): Payer: Self-pay | Admitting: Obstetrics

## 2019-12-27 ENCOUNTER — Inpatient Hospital Stay (EMERGENCY_DEPARTMENT_HOSPITAL)
Admission: AD | Admit: 2019-12-27 | Discharge: 2019-12-27 | Disposition: A | Discharge status: Home / Self Care | Payer: 59 | Source: Home / Self Care | Attending: Obstetrics and Gynecology | Admitting: Obstetrics and Gynecology

## 2019-12-27 ENCOUNTER — Other Ambulatory Visit: Payer: Self-pay

## 2019-12-27 ENCOUNTER — Encounter (HOSPITAL_COMMUNITY): Payer: Self-pay | Admitting: Obstetrics and Gynecology

## 2019-12-27 ENCOUNTER — Inpatient Hospital Stay (HOSPITAL_COMMUNITY)
Admission: AD | Admit: 2019-12-27 | Discharge: 2019-12-28 | DRG: 806 | Disposition: A | Discharge status: Home / Self Care | Payer: 59 | Attending: Obstetrics | Admitting: Obstetrics

## 2019-12-27 DIAGNOSIS — D62 Acute posthemorrhagic anemia: Secondary | ICD-10-CM | POA: Diagnosis not present

## 2019-12-27 DIAGNOSIS — Z3A38 38 weeks gestation of pregnancy: Secondary | ICD-10-CM | POA: Insufficient documentation

## 2019-12-27 DIAGNOSIS — O9081 Anemia of the puerperium: Secondary | ICD-10-CM | POA: Diagnosis not present

## 2019-12-27 DIAGNOSIS — O471 False labor at or after 37 completed weeks of gestation: Secondary | ICD-10-CM | POA: Insufficient documentation

## 2019-12-27 DIAGNOSIS — O211 Hyperemesis gravidarum with metabolic disturbance: Secondary | ICD-10-CM

## 2019-12-27 DIAGNOSIS — O479 False labor, unspecified: Secondary | ICD-10-CM

## 2019-12-27 DIAGNOSIS — O26893 Other specified pregnancy related conditions, third trimester: Secondary | ICD-10-CM | POA: Diagnosis present

## 2019-12-27 DIAGNOSIS — Z3689 Encounter for other specified antenatal screening: Secondary | ICD-10-CM

## 2019-12-27 LAB — CBC
HCT: 36 % (ref 36.0–46.0)
Hemoglobin: 12.3 g/dL (ref 12.0–15.0)
MCH: 30.4 pg (ref 26.0–34.0)
MCHC: 34.2 g/dL (ref 30.0–36.0)
MCV: 88.9 fL (ref 80.0–100.0)
Platelets: 229 10*3/uL (ref 150–400)
RBC: 4.05 MIL/uL (ref 3.87–5.11)
RDW: 13.2 % (ref 11.5–15.5)
WBC: 10.4 10*3/uL (ref 4.0–10.5)
nRBC: 0 % (ref 0.0–0.2)

## 2019-12-27 LAB — TYPE AND SCREEN
ABO/RH(D): A POS
Antibody Screen: NEGATIVE

## 2019-12-27 MED ORDER — BENZOCAINE-MENTHOL 20-0.5 % EX AERO
1.0000 "application " | INHALATION_SPRAY | CUTANEOUS | Status: DC | PRN
  Administered 2019-12-28: 1 via TOPICAL
  Filled 2019-12-27: qty 56

## 2019-12-27 MED ORDER — OXYTOCIN BOLUS FROM INFUSION: 333.0000 mL | Freq: Once | INTRAVENOUS | Status: DC

## 2019-12-27 MED ORDER — SOD CITRATE-CITRIC ACID 500-334 MG/5ML PO SOLN: 30.0000 mL | ORAL | Status: DC | PRN

## 2019-12-27 MED ORDER — METHYLERGONOVINE MALEATE 0.2 MG/ML IJ SOLN
INTRAMUSCULAR | Status: AC
  Filled 2019-12-27: qty 1

## 2019-12-27 MED ORDER — SODIUM CHLORIDE 0.9% FLUSH: 3.0000 mL | INTRAVENOUS | Status: DC | PRN

## 2019-12-27 MED ORDER — OXYTOCIN 10 UNIT/ML IJ SOLN
10.0000 [IU] | Freq: Once | INTRAMUSCULAR | Status: AC
  Administered 2019-12-27: 10 [IU] via INTRAMUSCULAR
  Filled 2019-12-27: qty 1

## 2019-12-27 MED ORDER — TETANUS-DIPHTH-ACELL PERTUSSIS 5-2.5-18.5 LF-MCG/0.5 IM SUSP: 0.5000 mL | Freq: Once | INTRAMUSCULAR | Status: DC

## 2019-12-27 MED ORDER — ONDANSETRON HCL 4 MG/2ML IJ SOLN: 4.0000 mg | Freq: Four times a day (QID) | INTRAMUSCULAR | Status: DC | PRN

## 2019-12-27 MED ORDER — LACTATED RINGERS IV SOLN: 500.0000 mL | INTRAVENOUS | Status: DC | PRN

## 2019-12-27 MED ORDER — WITCH HAZEL-GLYCERIN EX PADS: 1.0000 "application " | MEDICATED_PAD | CUTANEOUS | Status: DC | PRN

## 2019-12-27 MED ORDER — ACETAMINOPHEN 325 MG PO TABS: 650.0000 mg | ORAL_TABLET | ORAL | Status: DC | PRN

## 2019-12-27 MED ORDER — LACTATED RINGERS IV SOLN: INTRAVENOUS | Status: DC

## 2019-12-27 MED ORDER — LIDOCAINE HCL (PF) 1 % IJ SOLN: 30.0000 mL | INTRAMUSCULAR | Status: DC | PRN

## 2019-12-27 MED ORDER — METHYLERGONOVINE MALEATE 0.2 MG/ML IJ SOLN: 0.2000 mg | Freq: Once | INTRAMUSCULAR | Status: DC

## 2019-12-27 MED ORDER — ONDANSETRON HCL 4 MG PO TABS: 4.0000 mg | ORAL_TABLET | ORAL | Status: DC | PRN

## 2019-12-27 MED ORDER — SIMETHICONE 80 MG PO CHEW: 80.0000 mg | CHEWABLE_TABLET | ORAL | Status: DC | PRN

## 2019-12-27 MED ORDER — IBUPROFEN 600 MG PO TABS
600.0000 mg | ORAL_TABLET | Freq: Four times a day (QID) | ORAL | Status: DC
  Administered 2019-12-27 – 2019-12-28 (×4): 600 mg via ORAL
  Filled 2019-12-27 (×4): qty 1

## 2019-12-27 MED ORDER — COCONUT OIL OIL: 1.0000 "application " | TOPICAL_OIL | Status: DC | PRN

## 2019-12-27 MED ORDER — SODIUM CHLORIDE 0.9% FLUSH: 3.0000 mL | Freq: Two times a day (BID) | INTRAVENOUS | Status: DC

## 2019-12-27 MED ORDER — DIBUCAINE (PERIANAL) 1 % EX OINT: 1.0000 "application " | TOPICAL_OINTMENT | CUTANEOUS | Status: DC | PRN

## 2019-12-27 MED ORDER — FENTANYL CITRATE (PF) 100 MCG/2ML IJ SOLN: 50.0000 ug | INTRAMUSCULAR | Status: DC | PRN

## 2019-12-27 MED ORDER — ONDANSETRON HCL 4 MG/2ML IJ SOLN: 4.0000 mg | INTRAMUSCULAR | Status: DC | PRN

## 2019-12-27 MED ORDER — SODIUM CHLORIDE 0.9 % IV SOLN: 250.0000 mL | INTRAVENOUS | Status: DC | PRN

## 2019-12-27 MED ORDER — OXYTOCIN-SODIUM CHLORIDE 30-0.9 UT/500ML-% IV SOLN: 2.5000 [IU]/h | INTRAVENOUS | Status: DC

## 2019-12-27 NOTE — MAU Note (Signed)
Pt here with reports of contractions that started at 10pm, now every 4-5 mins. Denies LOF or vaginal bleeding. Good fetal movement. Cervix 1cm on last exam.

## 2019-12-27 NOTE — MAU Provider Note (Signed)
S: Ms. [name] is a 24 y.o. G3P1011 at [redacted]w[redacted]d  who presents to MAU today for labor evaluation.     Cervical exam by RN:  Dilation: 3 Effacement (%): 70 Cervical Position: Posterior Station: -3 Presentation: Vertex Exam by:: Camelia Eng, RN  Fetal Monitoring: Baseline: 140 Variability: average Accelerations: present Decelerations: absent Contractions: q 5-80min  MDM Discussed patient with RN. NST reviewed.   A: SIUP at [redacted]w[redacted]d  False labor  P: Discharge home Labor precautions and kick counts included in AVS Patient to follow-up with office as scheduled  Patient may return to MAU as needed or when in labor   Valora Piccolo 12/27/2019 6:36 AM

## 2019-12-27 NOTE — H&P (Signed)
OB ADMISSION/ HISTORY & PHYSICAL:  Admission Date: 12/27/2019 12:22 PM  Admit Diagnosis: PREG    [name] is a 24 y.o. female presenting for contractions. Pt called the answering service at 0300 this AM and came to MAU. SVE was 3cm x 2 and patient was sent home. She presented to the office this AM for a labor check and was 6cm. She was direct admitted to L&D. Denies vaginal bleeding or leaking of fluid. Endorses + fetal movement. Husband at the bedside providing support. Expecting baby boy, August.   Prenatal History: G3P1011   EDC : 01/10/2020, by [facility]  Prenatal care at [facility] since 8 weeks   Prenatal course complicated by: 1. Hyperemesis gravidarum - PICC Line for IVF in early pregnancy, removed at 28 weeks  Prenatal Labs: ABO, Rh:   A POS Antibody:   Negative Rubella:   Immune RPR:   Non-reactive HBsAg:   Negative HIV:   Negative GBS:   Negative 1 hr Glucola : 95 Genetic Screening: AFP 1 negative, NT WNL, declined Panorama Ultrasound: Growth on 8/12 - 5lb 6oz, 50%, vertex, anterior placenta, AFI 15cm    Maternal Diabetes: No Genetic Screening: Declined Maternal Ultrasounds/Referrals: Normal Fetal Ultrasounds or other Referrals:  None Maternal Substance Abuse:  No Significant Maternal Medications:  Meds include: Other: Zofran Significant Maternal Lab Results:  Group B Strep negative Other Comments:  None  Medical / Surgical History :  Past medical history:  Past Medical History:  Diagnosis Date  . Allergy   . 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 Anemia   . Bronchitis    Fall 2013, prescribed inhaler, does not take inhaler now.  . Chest pain   . Headache   . Infection    UTI  . Ovarian cyst   . Palpitations      Past surgical history:  Past Surgical History:  Procedure Laterality Date  . LAPAROSCOPIC APPENDECTOMY N/A 05/27/2012   Procedure: APPENDECTOMY LAPAROSCOPIC;  Surgeon: Judie Petit. Leonia Corona, MD;   Location: [facility] OR;  Service: Pediatrics;  Laterality: N/A;  . OTHER SURGICAL HISTORY     splinter R foot req surgery, L foot glass required surgery, dates unknown  . WISDOM TOOTH EXTRACTION       Family History:  Family History  Problem Relation Age of Onset  . Diabetes Mother   . Hypertension Mother   . Alcohol abuse Mother   . Appendicitis Brother   . Drug abuse Brother   . Cancer Maternal Aunt   . Cancer Paternal Uncle   . Diabetes Maternal Grandmother   . Diabetes Maternal Grandfather   . Hypertension Maternal Grandfather   . Cancer Paternal Grandmother        liver cancer     Social History:  reports that she has never smoked. She has never used smokeless tobacco. She reports that she does not drink alcohol and does not use drugs.   Allergies: Bee venom, Red dye, Wasp venom, Tamiflu [oseltamivir], and Latex   Current Medications at time of admission:  Medications Prior to Admission  Medication Sig Dispense Refill Last Dose  . doxylamine, Sleep, (UNISOM) 25 MG tablet Take 25 mg by mouth at bedtime as needed.     Marland Kitchen EPINEPHrine (EPI-PEN) 0.3 mg/0.3 mL DEVI tud (Patient taking differently: Inject 0.3 mg into the muscle once. tud) 1 Device 3   . famotidine (PEPCID) 40 MG tablet Take 40 mg by mouth  daily.     . ondansetron (ZOFRAN-ODT) 8 MG disintegrating tablet Take 8 mg by mouth 2 (two) times daily.     . Prenatal Vit-Fe Fumarate-FA (PRENATAL MULTIVITAMIN) TABS tablet Take 1 tablet daily at 12 noon by mouth.     . promethazine (PHENERGAN) 25 MG suppository Place 25 mg rectally every 6 (six) hours as needed for nausea or vomiting.     . promethazine (PHENERGAN) 25 MG tablet Take 25 mg by mouth every 4 (four) hours as needed for nausea or vomiting.     . pyridoxine (B-6) 100 MG tablet Take 100 mg by mouth daily.       Review of Systems: Review of Systems  All other systems reviewed and are negative.  Physical Exam: Vital signs and nursing notes reviewed.  No data found.    General: AAO x 3, NAD, coping well Heart: RRR Lungs:CTAB Abdomen: Gravid, NT, Leopold's 6.5-7lbs Extremities: no edema Genitalia / VE: Dilation: 7.5 Effacement (%): 90 Station: 0 Presentation: Vertex Exam by:: Dorisann Frames CNM   BBOW  FHR: 145BPM, mod variability, + accels, no decels TOCO: Ctx q 2.5-3 minutes  Labs:   Pending T&S, CBC, RPR  No results for input(s): WBC, HGB, HCT, PLT in the last 72 hours.   Assessment:  24 y.o. G3P1011 at [redacted]w[redacted]d, history of hyperemesis, on Zofran  1. Active stage of labor 2. FHR category 1 3. GBS negative 4. Desires unmedicated labor, open to epidural 5. Plans to breastfeed 6. Placenta disposal per patient request  Plan:  1. Admit to [facility] 2. Routine L&D orders 3. Analgesia/anesthesia PRN  4. Expectant management 5. Anticipate NSVB  Dr. Ernestina Penna notified of admission/plan of care.  June Leap CNM, MSN 12/27/2019, 12:40 PM

## 2019-12-27 NOTE — Discharge Instructions (Signed)
First Stage of Labor °Labor is your body's natural process of moving your baby and other structures, including the placenta and umbilical cord, out of your uterus. There are three stages of labor. How long each stage lasts is different for every woman. But certain events happen during each stage that are the same for everyone. °· The first stage starts when true labor begins. This stage ends when your cervix, which is the opening from your uterus into your vagina, is completely open (dilated). °· The second stage begins when your cervix is fully dilated and you start pushing. This stage ends when your baby is born. °· The third stage is the delivery of the organ that nourished your baby during pregnancy (placenta). °First stage of labor °As your due date gets closer, you may start to notice certain physical changes that mean labor is going to start soon. You may feel that your baby has dropped lower into your pelvis. You may experience irregular, often painless, contractions that go away when you walk around or lie down (Braxton Hicks contractions). This is also called false labor. °The first stage of labor begins when you start having contractions that come at regular (evenly spaced) intervals and your cervix starts to get thinner and wider in preparation for your baby to pass through. Birth care providers measure the dilation of your cervix in centimeters (cm). One centimeter is a little less than one-half of an inch. The first stage ends when your cervix is dilated to 10 cm. The first stage of labor is divided into three phases: °· Early phase. °· Active phase. °· Transitional phase. °The length of the first stage of labor varies. It may be longer if this is your first pregnancy. You may spend most of this stage at home trying to relax and stay comfortable. °How does this affect me? °During the first stage of labor, you will move through three phases. °What happens in the early phase? °· You will start to have  regular contractions that last 30-60 seconds. Contractions may come every 5-20 minutes. Keep track of your contractions and call your birth care provider. °· Your water may break during this phase. °· You may notice a clear or slightly bloody discharge of mucus (mucus plug) from your vagina. °· Your cervix will dilate to 3-6 cm. °What happens in the active phase? °The active phase usually lasts 3-5 hours. You may go to the hospital or birth center around this time. During the active phase: °· Your contractions will become stronger, longer, and more uncomfortable. °· Your contractions may last 45-90 seconds and come every 3-5 minutes. °· You may feel lower back pain. °· Your birth care providers may examine your cervix and feel your belly to find the position of your baby. °· You may have a monitor strapped to your belly to measure your contractions and your baby's heart rate. °· You may start using your pain management options. °· Your cervix may be dilated to 6 cm and may start to dilate more quickly. °What happens in the transitional phase? °The transitional phase typically lasts from 30 minutes to 2 hours. At the end of this phase, your cervix will be fully dilated to 10 cm. During the transitional phase: °· Contractions will get stronger and longer. °· Contractions may last 60-90 seconds and come less than 2 minutes apart. °· You may feel hot flashes, chills, or nausea. °How does this affect my baby? °During the first stage of labor, your baby will   gradually move down into your birth canal. °Follow these instructions at home and in the hospital or birth center: ° °· When labor first begins, try to stay calm. You are still in the early phase. If it is night, try to get some sleep. If it is day, try to relax and save your energy. You may want to make some calls and get ready to go to the hospital or birth center. °· When you are in the early phase, try these methods to help ease discomfort: °? Deep breathing and  muscle relaxation. °? Taking a walk. °? Taking a warm bath or shower. °· Drink some fluids and have a light snack if you feel like it. °· Keep track of your contractions. °· Based on the plan you created with your birth care provider, call when your contractions indicate it is time. °· If your water breaks, note the time, color, and odor of the fluid. °· When you are in the active phase, do your breathing exercises and rely on your support people and your team of birth care providers. °Contact a health care provider if: °· Your contractions are strong and regular. °· You have lower back pain or cramping. °· Your water breaks. °· You lose your mucus plug. °Get help right away if you: °· Have a severe headache that does not go away. °· Have changes in your vision. °· Have severe pain in your upper belly. °· Do not feel the baby move. °· Have bright red bleeding. °Summary °· The first stage of labor starts when true labor begins, and it ends when your cervix is dilated to 10 cm. °· The first stage of labor has three phases: early, active, and transitional. °· Your baby moves into the birth canal during the first stage of labor. °· You may have contractions that become stronger and longer. You may also lose your mucus plug and have your water break. °· Call your birth care provider when your contractions are frequent and strong enough to go to the hospital or birth center. °This information is not intended to replace advice given to you by your health care provider. Make sure you discuss any questions you have with your health care provider. °Document Revised: 07/28/2018 Document Reviewed: 06/20/2017 °Elsevier Patient Education © 2020 [employer] Inc. ° °

## 2019-12-28 LAB — CBC
HCT: 32.4 % — ABNORMAL LOW (ref 36.0–46.0)
Hemoglobin: 10.9 g/dL — ABNORMAL LOW (ref 12.0–15.0)
MCH: 29.9 pg (ref 26.0–34.0)
MCHC: 33.6 g/dL (ref 30.0–36.0)
MCV: 89 fL (ref 80.0–100.0)
Platelets: 195 10*3/uL (ref 150–400)
RBC: 3.64 MIL/uL — ABNORMAL LOW (ref 3.87–5.11)
RDW: 13.2 % (ref 11.5–15.5)
WBC: 10.7 10*3/uL — ABNORMAL HIGH (ref 4.0–10.5)
nRBC: 0 % (ref 0.0–0.2)

## 2019-12-28 LAB — RPR: RPR Ser Ql: NONREACTIVE

## 2019-12-28 MED ORDER — COCONUT OIL OIL: 1.0000 "application " | TOPICAL_OIL | 0 refills | Status: AC | PRN

## 2019-12-28 MED ORDER — IBUPROFEN 600 MG PO TABS: 600.0000 mg | ORAL_TABLET | Freq: Four times a day (QID) | ORAL | 0 refills | Status: AC

## 2019-12-28 NOTE — Progress Notes (Signed)
AVS printed and discharged instructions given to patient. Patient instructed to call for follow-up appointment and to pick up prescriptions. All questions answered and pt verbalized understanding.  °

## 2019-12-28 NOTE — Lactation Note (Signed)
This note was copied from a baby's chart. Lactation Consultation Note  Patient Name: [name] OVFIE'P Date: 12/28/2019 Reason for consult: Follow-up assessment;Early term 20-38.6wks  P2 mother whose infant is now 76 hours old.  This is an ETI at 38+0 weeks.  Mother breast fed her first child (now 25 years old) for 5-6 months.  Baby had a circumcision this a.m.  Mother requested lactation assistance to observe a latch.  When I arrived mother had baby latched to the left breast in the cradle hold.  Baby was actively sucking and mother denied pain.  She felt like this was the best latch she has been able to obtain since delivery.  Reviewed many basic breast feeding concepts while observing him feed for 20 minutes.  Discussed how to actively keep him sucking and demonstrated breast compressions and gentle stimulation.  Mother will feed 8-12 times/24 hours or sooner if baby desires.  Discussed cluster feeding and also informed mother that he may want to feed more tonight due to the circumcision this a.m.  Feeding plan in place.  Both parents are very receptive to learning and asked many appropriate questions.  I have no reservation about this family being discharged today.    Engorgement prevention/treatment discussed.  Mother has a manual pump and a DEBP for home use.  She has our OP phone number for concerns after discharge.  RN updated.   Maternal Data Formula Feeding for Exclusion: No Has patient been taught Hand Expression?: Yes Does the patient have breastfeeding experience prior to this delivery?: Yes  Feeding Feeding Type: Breast Fed  LATCH Score Latch: Grasps breast easily, tongue down, lips flanged, rhythmical sucking.  Audible Swallowing: Spontaneous and intermittent  Type of Nipple: Everted at rest and after stimulation  Comfort (Breast/Nipple): Soft / non-tender  Hold (Positioning): Assistance needed to correctly position infant at breast and maintain  latch.  LATCH Score: 9  Interventions Interventions: Breast feeding basics reviewed;Skin to skin;Breast massage;Hand express;Breast compression;Position options;Support pillows  Lactation Tools Discussed/Used     Consult Status Consult Status: Complete Date: 12/28/19 Follow-up type: Call as needed    Aaria Happ R Shamera Yarberry 12/28/2019, 1:34 PM

## 2019-12-28 NOTE — Discharge Summary (Signed)
Postpartum Discharge Summary  Date of Service updated 12/28/2019     Patient Name: [name] DOB: Mar 05, 1996 MRN: [id number]  Date of admission: 12/27/2019 Delivery date:12/27/2019  Delivering provider: Gavin Potters K  Date of discharge: 12/28/2019  Admitting diagnosis: Normal labor [O80, Z37.9] Intrauterine pregnancy: [redacted]w[redacted]d    Secondary diagnosis:  Principal Problem:   Postpartum care following vaginal delivery 9/8 Active Problems:   Normal labor   SVD (spontaneous vaginal delivery) 9/8  Additional problems: Hyperemesis gravidarum     Discharge diagnosis: Term Pregnancy Delivered and Anemia mild ABL Anemia                                               Post partum procedures: n/a Augmentation: AROM Complications: None precipitous labor  Hospital course: Onset of Labor With Vaginal Delivery      24y.o. yo [id number] admitted in Active Labor on 12/27/2019. Patient had an uncomplicated labor course as follows:  Membrane Rupture Time/Date: 1:03 PM ,12/27/2019   Delivery Method:Vaginal, Spontaneous  Episiotomy: None  Lacerations:  None  Patient had an uncomplicated postpartum course.  She is ambulating, tolerating a regular diet, passing flatus, and urinating well. Patient is discharged home in stable condition on 12/28/19.  Newborn Data: Birth date:12/27/2019  Birth time:1:36 PM  Gender:Female  Living status:Living  Apgars:9 ,9  Weight:3410 g   Magnesium Sulfate received: No BMZ received: No Rhophylac:N/A MMR:N/A T-DaP:Given prenatally Flu: N/A Transfusion:No  Physical exam  Vitals:   12/27/19 1640 12/27/19 2045 12/28/19 0040 12/28/19 0556  BP: 104/64 108/64 98/67 110/73  Pulse: 77 89 62 72  Resp: '18 18 18 18  ' Temp: 99.2 F (37.3 C) 98.4 F (36.9 C) 98.7 F (37.1 C) 98.2 F (36.8 C)  TempSrc: Oral Oral Oral Oral  SpO2:        Alert and oriented X3             Lungs: Clear and unlabored             Heart: regular rate and rhythm / no murmurs              Abdomen: soft, non-tender, non-distended              Fundus: firm, non-tender, U-1             Perineum: intact, no edema              Lochia: small rubra on pad              Extremities: no edema, no calf pain or tenderness Labs: Lab Results  Component Value Date   WBC 10.7 (H) 12/28/2019   HGB 10.9 (L) 12/28/2019   HCT 32.4 (L) 12/28/2019   MCV 89.0 12/28/2019   PLT 195 12/28/2019   CMP Latest Ref Rng & Units 12/10/2016  Glucose 65 - 99 mg/dL 74  BUN 6 - 20 mg/dL 13  Creatinine 0.44 - 1.00 mg/dL 0.44  Sodium 135 - 145 mmol/L 136  Potassium 3.5 - 5.1 mmol/L 3.7  Chloride 101 - 111 mmol/L 105  CO2 22 - 32 mmol/L 24  Calcium 8.9 - 10.3 mg/dL 8.7(L)  Total Protein 6.5 - 8.1 g/dL -  Total Bilirubin 0.3 - 1.2 mg/dL -  Alkaline Phos 38 - 126 U/L -  AST 15 - 41  U/L -  ALT 14 - 54 U/L -   Edinburgh Score: Edinburgh Postnatal Depression Scale Screening Tool 04/14/2017  I have been able to laugh and see the funny side of things. 0  I have looked forward with enjoyment to things. 0  I have blamed myself unnecessarily when things went wrong. 2  I have been anxious or worried for no good reason. 1  I have felt scared or panicky for no good reason. 1  Things have been getting on top of me. 1  I have been so unhappy that I have had difficulty sleeping. 0  I have felt sad or miserable. 0  I have been so unhappy that I have been crying. 0  The thought of harming myself has occurred to me. 0  Edinburgh Postnatal Depression Scale Total 5      After visit meds:  Allergies as of 12/28/2019      Reactions   Bee Venom Anaphylaxis   Red Dye Anaphylaxis   Wasp Venom Anaphylaxis   Tamiflu [oseltamivir] Hives   Latex Rash      Medication List    STOP taking these medications   doxylamine (Sleep) 25 MG tablet Commonly known as: UNISOM   ondansetron 8 MG disintegrating tablet Commonly known as: ZOFRAN-ODT   pyridoxine 100 MG tablet Commonly known as: B-6     TAKE these  medications   acetaminophen 325 MG tablet Commonly known as: TYLENOL Take 325-650 mg by mouth every 6 (six) hours as needed for mild pain or headache.   coconut oil Oil Apply 1 application topically as needed.   EPINEPHrine 0.3 mg/0.3 mL Devi Commonly known as: EPI-PEN tud What changed:   how much to take  how to take this  when to take this   famotidine 40 MG tablet Commonly known as: PEPCID Take 40 mg by mouth daily.   ibuprofen 600 MG tablet Commonly known as: ADVIL Take 1 tablet (600 mg total) by mouth every 6 (six) hours.   prenatal multivitamin Tabs tablet Take 1 tablet daily at 12 noon by mouth.        Discharge home in stable condition Infant Feeding: Breast Infant Disposition:home with mother Discharge instruction: per After Visit Summary and Postpartum booklet. Activity: Advance as tolerated. Pelvic rest for 6 weeks.  Diet: low salt diet Anticipated Birth Control: Unsure Postpartum Appointment:6 weeks Additional Postpartum F/U: Postpartum Depression checkup Future Appointments:No future appointments. Follow up Visit:  Follow-up Information    Aloha Gell, MD. Schedule an appointment as soon as possible for a visit in 6 week(s).   Specialty: Obstetrics and Gynecology Why: Postpartum visit  Contact information: [place] [address] [phone]                   12/28/2019 Darliss Cheney, CNM

## 2019-12-28 NOTE — Lactation Note (Signed)
This note was copied from a baby's chart. Lactation Consultation Note  Patient Name: [name] STMHD'Q Date: 12/28/2019 Reason for consult: Initial assessment;Early term 68-38.6wks  P2 mother whose infant is now 86 hours old.  This is an ETI at 38+0 weeks.  Mother breast fed her first child (now 25 years old) for 5-6 months.    Baby was in the nursery getting a circumcision when I arrived.  A discharge order has already been placed for after the 24 hour testing has been completed.  Mother would like for me to return and assist/observe with latching prior to discharge.  Since this is an ETI and no LATCH scores have been entered I also agree that baby needs to be observed for feeding.  I plan to review basic breast feeding concepts with mother at the next feeding.  Informed her that it is common for babies to be sleepy after circumcision but that I would like to assist her when she is ready to feed again.  Mother very appreciative.    Father present.  Nurse tech in room to update parents.   Maternal Data Formula Feeding for Exclusion: No Has patient been taught Hand Expression?: Yes Does the patient have breastfeeding experience prior to this delivery?: Yes  Feeding    LATCH Score                   Interventions    Lactation Tools Discussed/Used     Consult Status Consult Status: Follow-up Date: 12/28/19 Follow-up type: In-patient    Louise Victory R Akesha Uresti 12/28/2019, 10:53 AM

## 2019-12-28 NOTE — Progress Notes (Addendum)
PPD #1, SVD, intact, baby boy   S:  Reports feeling well, very happy with experience and appreciative. Reports this is a much better recovery than her first delivery. Desires early d/c home today              Tolerating po/ No nausea or vomiting / Denies dizziness or SOB             Bleeding is moderate             Pain controlled with Motrin             Up ad lib / ambulatory / voiding QS  Newborn breast feeding - working with lactation primarily on latch  / Circumcision - completed  O:               VS: BP 110/73 (BP Location: Right Arm)   Pulse 72   Temp 98.2 F (36.8 C) (Oral)   Resp 18   LMP 04/08/2019 (Exact Date)   SpO2 100%   Breastfeeding Unknown   Patient Vitals for the past 24 hrs:  BP Temp Temp src Pulse Resp SpO2  12/28/19 0556 110/73 98.2 F (36.8 C) Oral 72 18 --  12/28/19 0040 98/67 98.7 F (37.1 C) Oral 62 18 --  12/27/19 2045 108/64 98.4 F (36.9 C) Oral 89 18 --  12/27/19 1640 104/64 99.2 F (37.3 C) Oral 77 18 --  12/27/19 1540 111/72 99.1 F (37.3 C) Oral 78 18 100 %  12/27/19 1516 114/64 -- -- 67 16 --  12/27/19 1500 98/83 -- -- 78 16 --  12/27/19 1445 115/64 -- -- 74 14 --  12/27/19 1431 108/61 -- -- 73 16 --  12/27/19 1415 (!) 104/57 -- -- 74 16 --  12/27/19 1401 (!) 109/53 98.1 F (36.7 C) Oral 79 16 --  12/27/19 1354 113/90 -- -- 78 18 --     LABS:             Recent Labs    12/27/19 1301 12/28/19 0453  WBC 10.4 10.7*  HGB 12.3 10.9*  PLT 229 195               Blood type: --/--/A POS (09/08 1300)  Rubella:                       I&O: Intake/Output      09/08 0701 - 09/09 0700 09/09 0701 - 09/10 0700   Blood 50    Total Output 50    Net -50                       Physical Exam:             Alert and oriented X3  Lungs: Clear and unlabored  Heart: regular rate and rhythm / no murmurs  Abdomen: soft, non-tender, non-distended              Fundus: firm, non-tender, U-1  Perineum: intact, no edema   Lochia: small rubra on pad    Extremities: no edema, no calf pain or tenderness    A: PPD # 1, SVD  Mild ABL Anemia    Doing well - stable status  P: Routine post partum orders  Discharge home today  WOB discharge book given, instructions and warning s/s reviewed  Discussed outpatient lactation services PRN; see lactation prior to d/c  F/u in 6 weeks for PP visit   [name]  Jacqueline Jocelyn, MSN, CNM [facility]

## 2020-01-13 ENCOUNTER — Inpatient Hospital Stay (HOSPITAL_COMMUNITY): Admit: 2020-01-13 | Payer: Self-pay

## 2020-03-07 ENCOUNTER — Encounter: Payer: Self-pay | Admitting: Allergy

## 2020-03-07 ENCOUNTER — Ambulatory Visit (INDEPENDENT_AMBULATORY_CARE_PROVIDER_SITE_OTHER): Payer: 59 | Admitting: Allergy

## 2020-03-07 ENCOUNTER — Other Ambulatory Visit: Payer: Self-pay

## 2020-03-07 VITALS — BP 116/70 | HR 72 | Temp 97.4°F | Resp 17 | Ht 66.0 in | Wt 184.5 lb

## 2020-03-07 DIAGNOSIS — Z881 Allergy status to other antibiotic agents status: Secondary | ICD-10-CM | POA: Diagnosis not present

## 2020-03-07 DIAGNOSIS — L500 Allergic urticaria: Secondary | ICD-10-CM

## 2020-03-07 DIAGNOSIS — T781XXD Other adverse food reactions, not elsewhere classified, subsequent encounter: Secondary | ICD-10-CM

## 2020-03-07 DIAGNOSIS — T50905D Adverse effect of unspecified drugs, medicaments and biological substances, subsequent encounter: Secondary | ICD-10-CM

## 2020-03-07 DIAGNOSIS — T3695XD Adverse effect of unspecified systemic antibiotic, subsequent encounter: Secondary | ICD-10-CM

## 2020-03-07 DIAGNOSIS — T50905A Adverse effect of unspecified drugs, medicaments and biological substances, initial encounter: Secondary | ICD-10-CM | POA: Insufficient documentation

## 2020-03-07 DIAGNOSIS — T781XXA Other adverse food reactions, not elsewhere classified, initial encounter: Secondary | ICD-10-CM | POA: Insufficient documentation

## 2020-03-07 DIAGNOSIS — Z91030 Bee allergy status: Secondary | ICD-10-CM

## 2020-03-07 MED ORDER — EPINEPHRINE 0.3 MG/0.3ML IJ SOAJ: 0.3000 mg | INTRAMUSCULAR | 2 refills | Status: AC | PRN

## 2020-03-07 NOTE — Patient Instructions (Signed)
Drug allergy:  Continue to avoid penicillin type of antibiotics.  Schedule for penicillin skin testing and drug challenge.  You must be off antihistamines for 3-5 days before. Must be in good health and not ill. Plan on being in the office for 2-3 hours and must bring in the drug you want to do the oral challenge for - will send in prescription to pick up a few days before. You must call to schedule an appointment and specify it's for a drug challenge.   Red dye:  Avoid red dye. Get bloodwork:  We are ordering labs, so please allow 1-2 weeks for the results to come back. With the newly implemented Cures Act, the labs might be visible to you at the same time that they become visible to me. However, I will not address the results until all of the results are back, so please be patient.  In the meantime, continue recommendations in your patient instructions, including avoidance measures (if applicable), until you hear from me.  I have prescribed epinephrine injectable and demonstrated proper use. For mild symptoms you can take over the counter antihistamines such as Benadryl and monitor symptoms closely. If symptoms worsen or if you have severe symptoms including breathing issues, throat closure, significant swelling, whole body hives, severe diarrhea and vomiting, lightheadedness then inject epinephrine and seek immediate medical care afterwards.  Emergency action plan given.  Bee stings:  Avoid bee sting.  Get bloodwork.  Carry epinephrine with you as above.  Follow up for penicillin skin testing/drug challenge.

## 2020-03-07 NOTE — Assessment & Plan Note (Signed)
Throat tightening and shortness of breath over 10 years ago after bee sting.  No prior evaluation.  Avoid bee sting.  Get bloodwork.  Carry epinephrine with you as above.

## 2020-03-07 NOTE — Progress Notes (Signed)
New Patient Note  RE: [name] MRN: [id number] DOB: 09-25-1995 Date of Office Visit: 03/07/2020  Referring provider: No ref. provider found Primary care provider: Shon Hale, MD  Chief Complaint: Allergic Reaction  History of Present Illness: I had the pleasure of seeing [name] for initial evaluation at the [facility] on 03/07/2020. She is a 24 y.o. female, who is self-referred here for the evaluation of drug allergies.  Patient had an infection post partum. She was given an antibiotic for mastitis and uterine infection.  On the last day, patient broke out in hives after taking dicloxacillin 250mg  QID x 10 days.  This was the first time she took this type of medication. She is not aware of taking any penicillin type of antibiotics before.  This cleared up the mastitis but not the uterine infection.  Symptoms resolved within a few days after taking Claritin. She did not need to take prednisone for this. She had minimal throat itching and diarrhea. No respiratory compromise, no oral ulcers or joint pains.   She was then treated with clindamycin for the uterine infection with no issues.   Patient broke out in hives/itching after taking 1 dose of Tamiflu in the past which required a steroid injection. This occurred on the first dose.   Latex causes rash.  Patient is still breastfeeding. Currently on antibiotics for another UTI infection.  Assessment and Plan: [name] is a 24 y.o. female with: Drug reaction Patient broke out in hives on day 10 of taking dicloxacillin 250mg  4 times daily for mastitis.  Has some associated throat itching and diarrhea.  Symptoms resolved after taking Claritin for a few days.  Patient is not aware of taking any prior penicillin type of antibiotics before.  History of breaking out in hives after taking 1 dose of Tamiflu requiring steroid injection.  Latex causes a mild rash.  Usually IgE mediated drug  reactions occur a little earlier in the course.  Given clinical history we will schedule for penicillin skin testing and drug challenge.  Continue to avoid penicillin type of antibiotics and Tamiflu.  Adverse food reaction Red dye caused throat tightening and shortness of breath as a child and similar less severe symptoms as a teenager.  No prior evaluation.  Patient did self administer epinephrine during the last episode with good benefit.  Discussed with patient red dye allergy is not very common.  Continue to avoid red dye. Get bloodwork and if negative then will schedule for in office red dye challenge.  I have prescribed epinephrine injectable and demonstrated proper use. For mild symptoms you can take over the counter antihistamines such as Benadryl and monitor symptoms closely. If symptoms worsen or if you have severe symptoms including breathing issues, throat closure, significant swelling, whole body hives, severe diarrhea and vomiting, lightheadedness then inject epinephrine and seek immediate medical care afterwards.  Emergency action plan given.  History of systemic reaction to bee sting Throat tightening and shortness of breath over 10 years ago after bee sting.  No prior evaluation.  Avoid bee sting.  Get bloodwork.  Carry epinephrine with you as above.   Return for Drug challenge, Skin testing.  Meds ordered this encounter  Medications  . EPINEPHrine (AUVI-Q) 0.3 mg/0.3 mL IJ SOAJ injection    Sig: Inject 0.3 mg into the muscle as needed for anaphylaxis.    Dispense:  2 each    Refill:  2    [phone] (M)  Lab Orders     Allergen Hymenoptera Panel     Tryptase     Allergen, Red (Carmine) Dye, [id number]  Other allergy screening: Asthma: no Rhino conjunctivitis: yes  Mild rhino conjunctivitis symptoms during the pregnancy and took Claritin with good benefit.  Food allergy:  Red dye - caused throat tightening, shortness of breath as a child. Recently had  ice cream with red dye as a teenager and had some difficulty breathing and tongue swelling. Patient had to self-administer epinephrine with good benefit. Currently avoiding red dye.  Hymenoptera allergy: yes  Throat tightening, shortness of breath over 10 year sago. No prior work up. No epinephrine on hand currently.  Urticaria: no Eczema:no History of recurrent infections suggestive of immunodeficency: no  Diagnostics: None.  Past Medical History: Patient Active Problem List   Diagnosis Date Noted  . Drug reaction 03/07/2020  . Adverse food reaction 03/07/2020  . History of systemic reaction to bee sting 03/07/2020  . Normal labor 12/27/2019  . SVD (spontaneous vaginal delivery) 9/8 12/27/2019  . Postpartum care following vaginal delivery 9/8 12/27/2019  . Hyperemesis gravidarum before end of [redacted] week gestation with dehydration 06/02/2019   Past Medical History:  Diagnosis Date  . Allergy   . 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 Anemia   . Bronchitis    Fall 2013, prescribed inhaler, does not take inhaler now.  . Chest pain   . Headache   . Infection    UTI  . Ovarian cyst   . Palpitations   . Urticaria    Past Surgical History: Past Surgical History:  Procedure Laterality Date  . LAPAROSCOPIC APPENDECTOMY N/A 05/27/2012   Procedure: APPENDECTOMY LAPAROSCOPIC;  Surgeon: Judie Petit. Leonia Corona, MD;  Location: [facility] OR;  Service: Pediatrics;  Laterality: N/A;  . OTHER SURGICAL HISTORY     splinter R foot req surgery, L foot glass required surgery, dates unknown  . WISDOM TOOTH EXTRACTION     Medication List:  Current Outpatient Medications  Medication Sig Dispense Refill  . nitrofurantoin, macrocrystal-monohydrate, (MACROBID) 100 MG capsule Take 100 mg by mouth 2 (two) times daily.    . Prenatal Vit-Fe Fumarate-FA (PRENATAL MULTIVITAMIN) TABS tablet Take 1 tablet daily at 12 noon by mouth.    . Probiotic Product (PROBIOTIC-10  ULTIMATE) CAPS     . acetaminophen (TYLENOL) 325 MG tablet Take 325-650 mg by mouth every 6 (six) hours as needed for mild pain or headache.    . coconut oil OIL Apply 1 application topically as needed.  0  . EPINEPHrine (AUVI-Q) 0.3 mg/0.3 mL IJ SOAJ injection Inject 0.3 mg into the muscle as needed for anaphylaxis. 2 each 2  . famotidine (PEPCID) 40 MG tablet Take 40 mg by mouth daily.    Marland Kitchen ibuprofen (ADVIL) 600 MG tablet Take 1 tablet (600 mg total) by mouth every 6 (six) hours. 30 tablet 0   No current facility-administered medications for this visit.   Allergies: Allergies  Allergen Reactions  . Bee Venom Anaphylaxis  . Red Dye Anaphylaxis  . Wasp Venom Anaphylaxis  . Tamiflu [Oseltamivir] Hives  . Dicloxacillin Hives, Itching and Rash  . Latex Rash   Social History: Social History   Socioeconomic History  . Marital status: Married    Spouse name: Not on file  . Number of children: Not on file  . Years of education: Not on file  . Highest education level: Not on file  Occupational History  . Not on file  Tobacco Use  . Smoking status: Never Smoker  . Smokeless tobacco: Never Used  Vaping Use  . Vaping Use: Never used  Substance and Sexual Activity  . Alcohol use: No  . Drug use: No  . Sexual activity: Yes  Other Topics Concern  . Not on file  Social History Narrative  . Not on file   Social Determinants of Health   Financial Resource Strain:   . Difficulty of Paying Living Expenses: Not on file  Food Insecurity:   . Worried About Running Programme researcher, broadcasting/film/videout of Food in the Last Year: Not on file  . Ran Out of Food in the Last Year: Not on file  Transportation Needs:   . Lack of Transportation (Medical): Not on file  . Lack of Transportation (Non-Medical): Not on file  Physical Activity:   . Days of Exercise per Week: Not on file  . Minutes of Exercise per Session: Not on file  Stress:   . Feeling of Stress : Not on file  Social Connections:   . Frequency of  Communication with Friends and Family: Not on file  . Frequency of Social Gatherings with Friends and Family: Not on file  . Attends Religious Services: Not on file  . Active Member of Clubs or Organizations: Not on file  . Attends BankerClub or Organization Meetings: Not on file  . Marital Status: Not on file   Lives in a 25 year old home. Smoking: denies Occupation: stay at home  Environmental History: Water Damage/mildew in the house: yes Carpet in the family room: no Carpet in the bedroom: yes Heating: heat pump Cooling: heat pump Pet: no  Family History: Family History  Problem Relation Age of Onset  . Diabetes Mother   . Hypertension Mother   . Alcohol abuse Mother   . Appendicitis Brother   . Drug abuse Brother   . Urticaria Brother   . Cancer Maternal Aunt   . Asthma Maternal Aunt   . Cancer Paternal Uncle   . Diabetes Maternal Grandmother   . Diabetes Maternal Grandfather   . Hypertension Maternal Grandfather   . Cancer Paternal Grandmother        liver cancer  . Asthma Paternal Grandmother    Review of Systems  Constitutional: Positive for chills and fever. Negative for appetite change and unexpected weight change.  HENT: Negative for congestion and rhinorrhea.   Eyes: Negative for itching.  Respiratory: Negative for cough, chest tightness, shortness of breath and wheezing.   Cardiovascular: Negative for chest pain.  Gastrointestinal: Positive for diarrhea and nausea. Negative for abdominal pain.  Skin: Negative for rash.  Neurological: Negative for headaches.   Objective: BP 116/70   Pulse 72   Temp (!) 97.4 F (36.3 C) (Temporal)   Resp 17   Ht 5\' 6"  (1.676 m)   Wt 184 lb 8 oz (83.7 kg)   LMP 04/08/2019 (Exact Date)   SpO2 98%   BMI 29.78 kg/m  Body mass index is 29.78 kg/m. Physical Exam Vitals and nursing note reviewed.  Constitutional:      Appearance: Normal appearance. She is well-developed.  HENT:     Head: Normocephalic and atraumatic.      Right Ear: External ear normal.     Left Ear: External ear normal.     Nose: Nose normal.     Mouth/Throat:     Mouth: Mucous membranes are moist.     Pharynx: Oropharynx is clear.  Eyes:     Conjunctiva/sclera: Conjunctivae normal.  Cardiovascular:     Rate and Rhythm: Normal rate and regular rhythm.     Heart sounds: Normal heart sounds. No murmur heard.  No friction rub. No gallop.   Pulmonary:     Effort: Pulmonary effort is normal.     Breath sounds: Normal breath sounds. No wheezing, rhonchi or rales.  Abdominal:     Palpations: Abdomen is soft.  Musculoskeletal:     Cervical back: Neck supple.  Skin:    General: Skin is warm.     Findings: No rash.  Neurological:     Mental Status: She is alert and oriented to person, place, and time.  Psychiatric:        Behavior: Behavior normal.    The plan was reviewed with the patient/family, and all questions/concerned were addressed.  It was my pleasure to see [name] today and participate in her care. Please feel free to contact me with any questions or concerns.  Sincerely,  Wyline Mood, DO Allergy & Immunology  [facility] office: [phone] [facility] office: [phone]

## 2020-03-07 NOTE — Assessment & Plan Note (Signed)
Red dye caused throat tightening and shortness of breath as a child and similar less severe symptoms as a teenager.  No prior evaluation.  Patient did self administer epinephrine during the last episode with good benefit.  Discussed with patient red dye allergy is not very common.  Continue to avoid red dye. Get bloodwork and if negative then will schedule for in office red dye challenge.  I have prescribed epinephrine injectable and demonstrated proper use. For mild symptoms you can take over the counter antihistamines such as Benadryl and monitor symptoms closely. If symptoms worsen or if you have severe symptoms including breathing issues, throat closure, significant swelling, whole body hives, severe diarrhea and vomiting, lightheadedness then inject epinephrine and seek immediate medical care afterwards.  Emergency action plan given.

## 2020-03-07 NOTE — Assessment & Plan Note (Addendum)
Patient broke out in hives on day 10 of taking dicloxacillin 250mg  4 times daily for mastitis.  Has some associated throat itching and diarrhea.  Symptoms resolved after taking Claritin for a few days.  Patient is not aware of taking any prior penicillin type of antibiotics before.  History of breaking out in hives after taking 1 dose of Tamiflu requiring steroid injection.  Latex causes a mild rash.  Usually IgE mediated drug reactions occur a little earlier in the course.  Given clinical history we will schedule for penicillin skin testing and drug challenge.  Continue to avoid penicillin type of antibiotics and Tamiflu.

## 2020-03-11 ENCOUNTER — Telehealth: Payer: Self-pay

## 2020-03-11 NOTE — Telephone Encounter (Signed)
Informed pt of this and she stated understanding and will reach out to [employer] for delivery

## 2020-03-11 NOTE — Telephone Encounter (Signed)
Lm for pt to call us back she needs to call [employer] about setting up delivery of her auvi-q apparently she needs yo verify hippa with [employer] for them to ship it out

## 2020-03-27 ENCOUNTER — Telehealth: Payer: Self-pay | Admitting: Allergy

## 2020-03-27 NOTE — Telephone Encounter (Signed)
Please call patient to confirm drug testing and challenge for penicillin type antibiotics next week on 12/14.  Must be off antihistamines for 3-5 days before. Must be in good health and not ill. No vaccines/injections within the past 7 days. Not on any antibiotics.   Plan on being in the office for 2-3 hours and must bring in the medication you want to do the oral challenge for - dicloxacillin. Please ask patient which pharmacy I need to sent this to.    Thank you.

## 2020-03-28 NOTE — Telephone Encounter (Signed)
Called and left a voicemail asking for patient to return call to discuss.  °

## 2020-04-01 MED ORDER — DICLOXACILLIN SODIUM 250 MG PO CAPS: ORAL_CAPSULE | ORAL | 0 refills | Status: DC

## 2020-04-01 NOTE — Progress Notes (Deleted)
Follow Up Note  RE: [name] MRN: [id number] DOB: 1995-05-02 Date of Office Visit: 04/02/2020  Referring provider: Shon Hale, * Primary care provider: Shon Hale, MD  Chief Complaint: No chief complaint on file.  Assessment and Plan: [name] is a 24 y.o. female with: No problem-specific Assessment & Plan notes found for this encounter.  No follow-ups on file.  Plan: Challenge drug: dicloxacillin Challenge as per protocol: {Blank single:19197::"Passed","Failed"} Total time: ***  For next 24 hours monitor for hives, swelling, shortness of breath and dizziness. If you see these symptoms, use Benadryl for mild symptoms and epinephrine for more severe symptoms and call 911.  If no adverse symptoms in the next 24 hours then will take off drug from allergy list.  History of Present Illness: I had the pleasure of seeing [name] for a follow up visit at the [facility] on 04/01/2020. She is a 24 y.o. female, who is being followed for 03/07/2020. Her previous allergy office visit was on 03/07/20 with Dr. Selena Batten. Today she is here for penicillin/dicloxacillin drug testing and challenge.   History of Reaction:  ? Bloodwork  Drug reaction Patient broke out in hives on day 10 of taking dicloxacillin 250mg  4 times daily for mastitis.  Has some associated throat itching and diarrhea.  Symptoms resolved after taking Claritin for a few days.  Patient is not aware of taking any prior penicillin type of antibiotics before.  History of breaking out in hives after taking 1 dose of Tamiflu requiring steroid injection.  Latex causes a mild rash.  Usually IgE mediated drug reactions occur a little earlier in the course.  Given clinical history we will schedule for penicillin skin testing and drug challenge.  Continue to avoid penicillin type of antibiotics and Tamiflu.  Adverse food reaction Red dye caused throat tightening and  shortness of breath as a child and similar less severe symptoms as a teenager.  No prior evaluation.  Patient did self administer epinephrine during the last episode with good benefit.  Discussed with patient red dye allergy is not very common.  Continue to avoid red dye.  Get bloodwork and if negative then will schedule for in office red dye challenge.  I have prescribed epinephrine injectable and demonstrated proper use. For mild symptoms you can take over the counter antihistamines such as Benadryl and monitor symptoms closely. If symptoms worsen or if you have severe symptoms including breathing issues, throat closure, significant swelling, whole body hives, severe diarrhea and vomiting, lightheadedness then inject epinephrine and seek immediate medical care afterwards.  Emergency action plan given.  History of systemic reaction to bee sting Throat tightening and shortness of breath over 10 years ago after bee sting.  No prior evaluation.  Avoid bee sting.  Get bloodwork.  Carry epinephrine with you as above.   Return for Drug challenge, Skin testing.  Interval History: Patient has not been ill, she has not had any accidental exposures to the culprit medication.   Recent/Current History: Pulmonary disease: {Blank single:19197::"yes","no"} Cardiac disease: {Blank single:19197::"yes","no"} Respiratory infection: {Blank single:19197::"yes","no"} Rash: {Blank single:19197::"yes","no"} Itch: {Blank single:19197::"yes","no"} Swelling: {Blank single:19197::"yes","no"} Cough: {Blank single:19197::"yes","no"} Shortness of breath: {Blank single:19197::"yes","no"} Runny/stuffy nose: {Blank single:19197::"yes","no"} Itchy eyes: {Blank single:19197::"yes","no"} Beta-blocker use: {Blank single:19197::"yes","no"}  Patient/guardian was informed of the test procedure with verbalized understanding of the risk of anaphylaxis. Consent was signed.   Last antihistamine use: *** Last  beta-blocker use: ***  Medication List:  Current Outpatient Medications  Medication Sig  Dispense Refill  . acetaminophen (TYLENOL) 325 MG tablet Take 325-650 mg by mouth every 6 (six) hours as needed for mild pain or headache.    . coconut oil OIL Apply 1 application topically as needed.  0  . dicloxacillin (DYNAPEN) 250 MG capsule Do NOT take at home. Bring to Dr. Elmyra Ricks office for a drug challenge. 4 capsule 0  . EPINEPHrine (AUVI-Q) 0.3 mg/0.3 mL IJ SOAJ injection Inject 0.3 mg into the muscle as needed for anaphylaxis. 2 each 2  . famotidine (PEPCID) 40 MG tablet Take 40 mg by mouth daily.    Marland Kitchen ibuprofen (ADVIL) 600 MG tablet Take 1 tablet (600 mg total) by mouth every 6 (six) hours. 30 tablet 0  . nitrofurantoin, macrocrystal-monohydrate, (MACROBID) 100 MG capsule Take 100 mg by mouth 2 (two) times daily.    . Prenatal Vit-Fe Fumarate-FA (PRENATAL MULTIVITAMIN) TABS tablet Take 1 tablet daily at 12 noon by mouth.    . Probiotic Product (PROBIOTIC-10 ULTIMATE) CAPS      No current facility-administered medications for this visit.   Allergies: Allergies  Allergen Reactions  . Bee Venom Anaphylaxis  . Red Dye Anaphylaxis  . Wasp Venom Anaphylaxis  . Tamiflu [Oseltamivir] Hives  . Dicloxacillin Hives, Itching and Rash  . Latex Rash   I reviewed her past medical history, social history, family history, and environmental history and no significant changes have been reported from her previous visit.  Review of Systems  Constitutional: Positive for chills and fever. Negative for appetite change and unexpected weight change.  HENT: Negative for congestion and rhinorrhea.   Eyes: Negative for itching.  Respiratory: Negative for cough, chest tightness, shortness of breath and wheezing.   Cardiovascular: Negative for chest pain.  Gastrointestinal: Positive for diarrhea and nausea. Negative for abdominal pain.  Skin: Negative for rash.  Neurological: Negative for headaches.    Objective: There were no vitals taken for this visit. There is no height or weight on file to calculate BMI. Physical Exam Vitals and nursing note reviewed.  Constitutional:      Appearance: Normal appearance. She is well-developed.  HENT:     Head: Normocephalic and atraumatic.     Right Ear: External ear normal.     Left Ear: External ear normal.     Nose: Nose normal.     Mouth/Throat:     Mouth: Mucous membranes are moist.     Pharynx: Oropharynx is clear.  Eyes:     Conjunctiva/sclera: Conjunctivae normal.  Cardiovascular:     Rate and Rhythm: Normal rate and regular rhythm.     Heart sounds: Normal heart sounds. No murmur heard. No friction rub. No gallop.   Pulmonary:     Effort: Pulmonary effort is normal.     Breath sounds: Normal breath sounds. No wheezing, rhonchi or rales.  Abdominal:     Palpations: Abdomen is soft.  Musculoskeletal:     Cervical back: Neck supple.  Skin:    General: Skin is warm.     Findings: No rash.  Neurological:     Mental Status: She is alert and oriented to person, place, and time.  Psychiatric:        Behavior: Behavior normal.     Diagnostics: Spirometry:  Tracings reviewed. Her effort: {Blank single:19197::"Good reproducible efforts.","It was hard to get consistent efforts and there is a question as to whether this reflects a maximal maneuver.","Poor effort, data can not be interpreted."} FVC: ***L FEV1: ***L, ***% predicted FEV1/FVC ratio: ***% Interpretation: {  Blank single:19197::"Spirometry consistent with mild obstructive disease","Spirometry consistent with moderate obstructive disease","Spirometry consistent with severe obstructive disease","Spirometry consistent with possible restrictive disease","Spirometry consistent with mixed obstructive and restrictive disease","Spirometry uninterpretable due to technique","Spirometry consistent with normal pattern","No overt abnormalities noted given today's efforts"}.  Please see  scanned spirometry results for details.  Skin Testing: {Blank single:19197::"None","Deferred due to recent antihistamines use"}. Positive test to: ***. Negative test to: ***.  Results discussed with patient/family.   Previous notes and tests were reviewed. The plan was reviewed with the patient/family, and all questions/concerned were addressed.  It was my pleasure to see [name] today and participate in her care. Please feel free to contact me with any questions or concerns.  Sincerely,  Wyline Mood, DO Allergy & Immunology  [facility] office: [phone] [facility] office: [phone]

## 2020-04-01 NOTE — Addendum Note (Signed)
Addended by: Ellamae Sia on: 04/01/2020 08:49 AM   Modules accepted: Orders

## 2020-04-01 NOTE — Telephone Encounter (Signed)
Patient called.  Will send in Rx to [facility] on [address].

## 2020-04-02 ENCOUNTER — Telehealth: Payer: Self-pay | Admitting: Allergy

## 2020-04-02 ENCOUNTER — Encounter: Payer: 59 | Admitting: Allergy

## 2020-04-02 NOTE — Telephone Encounter (Signed)
Patient called and try to reach [facility] but got [place] and said that the penicillia will not be ready until 6:00 this evening.so I reschedule her for jan. But she would like for a nurse to call her and let her know what to do. She needs to know if she need to go a head and pick it up or put it on hold. [phone]

## 2020-04-24 ENCOUNTER — Other Ambulatory Visit: Payer: Self-pay | Admitting: *Deleted

## 2020-04-24 MED ORDER — DICLOXACILLIN SODIUM 250 MG PO CAPS: ORAL_CAPSULE | ORAL | 0 refills | Status: AC

## 2020-04-24 NOTE — Telephone Encounter (Signed)
Medication has been sent back in for the patient to bring to her challenge on the 27th. Called and left a detailed voicemail for the patient advising.

## 2020-04-24 NOTE — Telephone Encounter (Signed)
Patient called to reschedule her appointment because family is sick. Rescheduled for 05/16/20. She said she tried picking up the medication for this appointment, but the pharmacy told her they were out of it. I told her we could send another request. [facility] [address].

## 2020-04-25 ENCOUNTER — Encounter: Payer: 59 | Admitting: Allergy

## 2020-05-14 NOTE — Telephone Encounter (Signed)
Spoke with pharmacy and they are going to try to order med through a different warehouse and should have it ready tomorrow. I left a detailed message for pt to let her know.

## 2020-05-14 NOTE — Telephone Encounter (Addendum)
Patient states that her pharmacy [facility] will not have the medication until Friday, but her appointment is this Thursday 1/27. Patient states the pharmacy seemed unsure that they would actually get the medication then and she would like to know if we can find a [facility] in [place] to sent it to instead so that she can keep her appointment for Thursday.   Please advise.

## 2020-05-15 NOTE — Progress Notes (Deleted)
Follow Up Note  RE: [name] MRN: [id number] DOB: 08/06/1995 Date of Office Visit: 05/16/2020  Referring provider: Shon Carroll, * Primary care provider: Shon Hale, MD  Chief Complaint: No chief complaint on file.  Assessment and Plan: [name] is a 25 y.o. female with: No problem-specific Assessment & Plan notes found for this encounter.  No follow-ups on file.  Plan: Challenge drug: *** Challenge as per protocol: {Blank single:19197::"Passed","Failed"} Total time: ***  For next 24 hours monitor for hives, swelling, shortness of breath and dizziness. If you see these symptoms, use Benadryl for mild symptoms and epinephrine for more severe symptoms and call 911.  If no adverse symptoms in the next 24 hours then will take off drug from allergy list.  History of Present Illness: I had the pleasure of seeing [name] for a follow up visit at the [facility] on 05/15/2020. She is a 25 y.o. female, who is being followed for drug reactions, adverse food reaction, reaction to bee stings. Her previous allergy office visit was on 03/07/2020 with Dr. Selena Carroll. Today she is here for penicillin drug testing and challenge.   History of Reaction: Drug reaction Patient broke out in hives on day 10 of taking dicloxacillin 250mg  4 times daily for mastitis.  Has some associated throat itching and diarrhea.  Symptoms resolved after taking Claritin for a few days.  Patient is not aware of taking any prior penicillin type of antibiotics before.  History of breaking out in hives after taking 1 dose of Tamiflu requiring steroid injection.  Latex causes a mild rash.  Usually IgE mediated drug reactions occur a little earlier in the course.  Given clinical history we will schedule for penicillin skin testing and drug challenge.  Continue to avoid penicillin type of antibiotics and Tamiflu.  Adverse food reaction Red dye caused throat tightening  and shortness of breath as a child and similar less severe symptoms as a teenager.  No prior evaluation.  Patient did self administer epinephrine during the last episode with good benefit.  Discussed with patient red dye allergy is not very common.  Continue to avoid red dye.  Get bloodwork and if negative then will schedule for in office red dye challenge.  I have prescribed epinephrine injectable and demonstrated proper use. For mild symptoms you can take over the counter antihistamines such as Benadryl and monitor symptoms closely. If symptoms worsen or if you have severe symptoms including breathing issues, throat closure, significant swelling, whole body hives, severe diarrhea and vomiting, lightheadedness then inject epinephrine and seek immediate medical care afterwards.  Emergency action plan given.  History of systemic reaction to bee sting Throat tightening and shortness of breath over 10 years ago after bee sting.  No prior evaluation.  Avoid bee sting.  Get bloodwork.  Carry epinephrine with you as above.   Return for Drug challenge, Skin testing.  Interval History: Patient has not been ill, she has not had any accidental exposures to the culprit medication.   Recent/Current History: Pulmonary disease: {Blank single:19197::"yes","no"} Cardiac disease: {Blank single:19197::"yes","no"} Respiratory infection: {Blank single:19197::"yes","no"} Rash: {Blank single:19197::"yes","no"} Itch: {Blank single:19197::"yes","no"} Swelling: {Blank single:19197::"yes","no"} Cough: {Blank single:19197::"yes","no"} Shortness of breath: {Blank single:19197::"yes","no"} Runny/stuffy nose: {Blank single:19197::"yes","no"} Itchy eyes: {Blank single:19197::"yes","no"} Beta-blocker use: {Blank single:19197::"yes","no"}  Patient/guardian was informed of the test procedure with verbalized understanding of the risk of anaphylaxis. Consent was signed.   Last antihistamine use: *** Last  beta-blocker use: ***  Medication List:  Current Outpatient  Medications  Medication Sig Dispense Refill  . acetaminophen (TYLENOL) 325 MG tablet Take 325-650 mg by mouth every 6 (six) hours as needed for mild pain or headache.    . coconut oil OIL Apply 1 application topically as needed.  0  . dicloxacillin (DYNAPEN) 250 MG capsule Do NOT take at home. Bring to [name] office for a drug challenge. 4 capsule 0  . EPINEPHrine (AUVI-Q) 0.3 mg/0.3 mL IJ SOAJ injection Inject 0.3 mg into the muscle as needed for anaphylaxis. 2 each 2  . famotidine (PEPCID) 40 MG tablet Take 40 mg by mouth daily.    Marland Kitchen ibuprofen (ADVIL) 600 MG tablet Take 1 tablet (600 mg total) by mouth every 6 (six) hours. 30 tablet 0  . nitrofurantoin, macrocrystal-monohydrate, (MACROBID) 100 MG capsule Take 100 mg by mouth 2 (two) times daily.    . Prenatal Vit-Fe Fumarate-FA (PRENATAL MULTIVITAMIN) TABS tablet Take 1 tablet daily at 12 noon by mouth.    . Probiotic Product (PROBIOTIC-10 ULTIMATE) CAPS      No current facility-administered medications for this visit.   Allergies: Allergies  Allergen Reactions  . Bee Venom Anaphylaxis  . Red Dye Anaphylaxis  . Wasp Venom Anaphylaxis  . Tamiflu [Oseltamivir] Hives  . Dicloxacillin Hives, Itching and Rash  . Latex Rash   I reviewed her past medical history, social history, family history, and environmental history and no significant changes have been reported from her previous visit.  Review of Systems  Constitutional: Positive for chills and fever. Negative for appetite change and unexpected weight change.  HENT: Negative for congestion and rhinorrhea.   Eyes: Negative for itching.  Respiratory: Negative for cough, chest tightness, shortness of breath and wheezing.   Cardiovascular: Negative for chest pain.  Gastrointestinal: Positive for diarrhea and nausea. Negative for abdominal pain.  Skin: Negative for rash.  Neurological: Negative for headaches.    Objective: There were no vitals taken for this visit. There is no height or weight on file to calculate BMI. Physical Exam Vitals and nursing note reviewed.  Constitutional:      Appearance: Normal appearance. She is well-developed.  HENT:     Head: Normocephalic and atraumatic.     Right Ear: External ear normal.     Left Ear: External ear normal.     Nose: Nose normal.     Mouth/Throat:     Mouth: Mucous membranes are moist.     Pharynx: Oropharynx is clear.  Eyes:     Conjunctiva/sclera: Conjunctivae normal.  Cardiovascular:     Rate and Rhythm: Normal rate and regular rhythm.     Heart sounds: Normal heart sounds. No murmur heard. No friction rub. No gallop.   Pulmonary:     Effort: Pulmonary effort is normal.     Breath sounds: Normal breath sounds. No wheezing, rhonchi or rales.  Abdominal:     Palpations: Abdomen is soft.  Musculoskeletal:     Cervical back: Neck supple.  Skin:    General: Skin is warm.     Findings: No rash.  Neurological:     Mental Status: She is alert and oriented to person, place, and time.  Psychiatric:        Behavior: Behavior normal.     Diagnostics: Spirometry:  Tracings reviewed. Her effort: {Blank single:19197::"Good reproducible efforts.","It was hard to get consistent efforts and there is a question as to whether this reflects a maximal maneuver.","Poor effort, data can not be interpreted."} FVC: ***L FEV1: ***L, ***% predicted  FEV1/FVC ratio: ***% Interpretation: {Blank single:19197::"Spirometry consistent with mild obstructive disease","Spirometry consistent with moderate obstructive disease","Spirometry consistent with severe obstructive disease","Spirometry consistent with possible restrictive disease","Spirometry consistent with mixed obstructive and restrictive disease","Spirometry uninterpretable due to technique","Spirometry consistent with normal pattern","No overt abnormalities noted given today's efforts"}.  Please see  scanned spirometry results for details.  Skin Testing: {Blank single:19197::"None","Deferred due to recent antihistamines use"}. Positive test to: ***. Negative test to: ***.  Results discussed with patient/family.   Previous notes and tests were reviewed. The plan was reviewed with the patient/family, and all questions/concerned were addressed.  It was my pleasure to see Donita today and participate in her care. Please feel free to contact me with any questions or concerns.  Sincerely,  Wyline Mood, DO Allergy & Immunology  [facility] office: [phone] [facility] office: [phone]

## 2020-05-16 ENCOUNTER — Encounter: Payer: Self-pay | Admitting: Allergy

## 2020-05-16 DIAGNOSIS — T50905D Adverse effect of unspecified drugs, medicaments and biological substances, subsequent encounter: Secondary | ICD-10-CM

## 2020-05-27 DIAGNOSIS — R062 Wheezing: Secondary | ICD-10-CM | POA: Diagnosis not present

## 2020-05-27 DIAGNOSIS — R8281 Pyuria: Secondary | ICD-10-CM | POA: Diagnosis not present

## 2020-05-27 DIAGNOSIS — R509 Fever, unspecified: Secondary | ICD-10-CM | POA: Diagnosis not present

## 2020-08-01 DIAGNOSIS — Z20822 Contact with and (suspected) exposure to covid-19: Secondary | ICD-10-CM | POA: Diagnosis not present

## 2020-08-03 DIAGNOSIS — Z20822 Contact with and (suspected) exposure to covid-19: Secondary | ICD-10-CM | POA: Diagnosis not present

## 2020-08-06 DIAGNOSIS — J069 Acute upper respiratory infection, unspecified: Secondary | ICD-10-CM | POA: Diagnosis not present

## 2021-01-09 DIAGNOSIS — Z23 Encounter for immunization: Secondary | ICD-10-CM | POA: Diagnosis not present

## 2021-01-13 DIAGNOSIS — M654 Radial styloid tenosynovitis [de Quervain]: Secondary | ICD-10-CM | POA: Diagnosis not present

## 2021-05-11 IMAGING — XA IR PICC >5YO
1 series · 1 of 1 positions shown · non-contrast
Comparison: none

INDICATION: 23-year-old female with a history of hyper emesis

[Series 1: fl (-) angio · 1 of 1 slices shown]
[im 1/1]
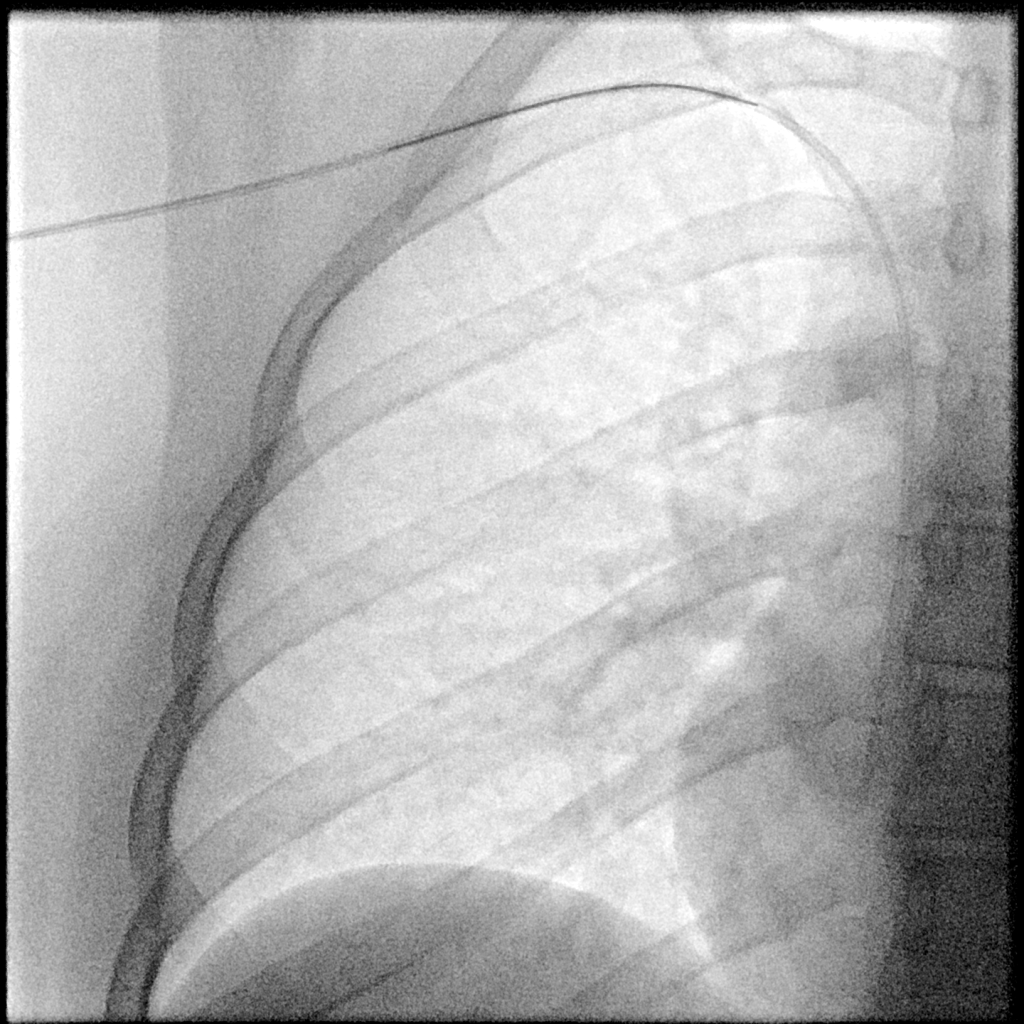

[1 of 1 positions shown; findings below may reference images not displayed]

EXAM:
PICC LINE PLACEMENT WITH ULTRASOUND AND FLUOROSCOPIC GUIDANCE

MEDICATIONS:
None

ANESTHESIA/SEDATION:
None

FLUOROSCOPY TIME:  Fluoroscopy Time: 1 minutes 12 seconds (1 mGy).

COMPLICATIONS:
None

PROCEDURE:
Informed written consent was obtained from the patient after a
thorough discussion of the procedural risks, benefits and
alternatives. All questions were addressed. Maximal Sterile Barrier
Technique was utilized including caps, mask, sterile gowns, sterile
gloves, sterile drape, hand hygiene and skin antiseptic. A timeout
was performed prior to the initiation of the procedure.

Patient was position in the supine position on the fluoroscopy table
with the right arm abducted 90 degrees. Ultrasound survey of the
upper extremity was performed with images stored and sent to PACs.

The right basilic vein was selected for access. Of note the patient
had no cephalic vein, and all of the veins of the upper extremity
are small.

Once the patient was prepped and draped in the usual sterile
fashion, the skin and subcutaneous tissues were generously
infiltrated with 1% lidocaine for local anesthesia.

A micropuncture access kit was then used to access the targeted
vein. Wire was passed centrally, confirmed to be within the venous
system under fluoroscopy. A small stab incision was made with an 11
blade scalpel and the sheath was then placed over the wire.
Estimated length of the catheter was then performed with the
indwelling wire.

Catheter was amputated at 42 cm length and placed with coaxial wire
through the peel-away.

Single-lumen, power injectable PICC in the basilic vein. Tip
confirmed at the cavoatrial junction, and the catheter is ready for
use.

Stat lock was placed.

Patient tolerated the procedure well and remained hemodynamically
stable throughout.

No complications were encountered and no significant blood loss was
encountered.
IMPRESSION: Status post right upper extremity PICC via the basilic vein.
Catheter ready for use.

## 2021-05-21 NOTE — Telephone Encounter (Signed)
error 

## 2021-10-15 DIAGNOSIS — F4320 Adjustment disorder, unspecified: Secondary | ICD-10-CM | POA: Diagnosis not present

## 2021-10-29 DIAGNOSIS — F4320 Adjustment disorder, unspecified: Secondary | ICD-10-CM | POA: Diagnosis not present

## 2021-10-30 DIAGNOSIS — R0602 Shortness of breath: Secondary | ICD-10-CM | POA: Diagnosis not present

## 2021-10-30 DIAGNOSIS — Z0000 Encounter for general adult medical examination without abnormal findings: Secondary | ICD-10-CM | POA: Diagnosis not present

## 2021-11-03 DIAGNOSIS — T63441A Toxic effect of venom of bees, accidental (unintentional), initial encounter: Secondary | ICD-10-CM | POA: Diagnosis not present

## 2021-11-06 DIAGNOSIS — F4320 Adjustment disorder, unspecified: Secondary | ICD-10-CM | POA: Diagnosis not present

## 2021-11-11 DIAGNOSIS — F4320 Adjustment disorder, unspecified: Secondary | ICD-10-CM | POA: Diagnosis not present

## 2021-12-31 DIAGNOSIS — Z23 Encounter for immunization: Secondary | ICD-10-CM | POA: Diagnosis not present

## 2022-01-20 DIAGNOSIS — F4312 Post-traumatic stress disorder, chronic: Secondary | ICD-10-CM | POA: Diagnosis not present

## 2022-01-28 DIAGNOSIS — F4312 Post-traumatic stress disorder, chronic: Secondary | ICD-10-CM | POA: Diagnosis not present

## 2022-02-04 DIAGNOSIS — F4312 Post-traumatic stress disorder, chronic: Secondary | ICD-10-CM | POA: Diagnosis not present

## 2022-02-12 DIAGNOSIS — F4312 Post-traumatic stress disorder, chronic: Secondary | ICD-10-CM | POA: Diagnosis not present

## 2022-02-18 DIAGNOSIS — F4312 Post-traumatic stress disorder, chronic: Secondary | ICD-10-CM | POA: Diagnosis not present

## 2022-03-04 DIAGNOSIS — F4322 Adjustment disorder with anxiety: Secondary | ICD-10-CM | POA: Diagnosis not present

## 2022-03-18 DIAGNOSIS — F4312 Post-traumatic stress disorder, chronic: Secondary | ICD-10-CM | POA: Diagnosis not present

## 2022-03-25 DIAGNOSIS — F4312 Post-traumatic stress disorder, chronic: Secondary | ICD-10-CM | POA: Diagnosis not present

## 2022-04-01 DIAGNOSIS — F4312 Post-traumatic stress disorder, chronic: Secondary | ICD-10-CM | POA: Diagnosis not present

## 2022-04-08 DIAGNOSIS — F4312 Post-traumatic stress disorder, chronic: Secondary | ICD-10-CM | POA: Diagnosis not present

## 2022-04-22 DIAGNOSIS — F4322 Adjustment disorder with anxiety: Secondary | ICD-10-CM | POA: Diagnosis not present

## 2022-04-29 DIAGNOSIS — F4322 Adjustment disorder with anxiety: Secondary | ICD-10-CM | POA: Diagnosis not present

## 2022-05-06 DIAGNOSIS — F4322 Adjustment disorder with anxiety: Secondary | ICD-10-CM | POA: Diagnosis not present

## 2022-05-13 DIAGNOSIS — F4322 Adjustment disorder with anxiety: Secondary | ICD-10-CM | POA: Diagnosis not present

## 2022-05-13 DIAGNOSIS — A084 Viral intestinal infection, unspecified: Secondary | ICD-10-CM | POA: Diagnosis not present

## 2022-05-20 DIAGNOSIS — F4322 Adjustment disorder with anxiety: Secondary | ICD-10-CM | POA: Diagnosis not present

## 2022-05-27 DIAGNOSIS — F4322 Adjustment disorder with anxiety: Secondary | ICD-10-CM | POA: Diagnosis not present

## 2022-06-03 DIAGNOSIS — F4322 Adjustment disorder with anxiety: Secondary | ICD-10-CM | POA: Diagnosis not present

## 2022-06-18 DIAGNOSIS — F4322 Adjustment disorder with anxiety: Secondary | ICD-10-CM | POA: Diagnosis not present

## 2022-06-23 DIAGNOSIS — F4322 Adjustment disorder with anxiety: Secondary | ICD-10-CM | POA: Diagnosis not present

## 2022-08-14 DIAGNOSIS — F4322 Adjustment disorder with anxiety: Secondary | ICD-10-CM | POA: Diagnosis not present

## 2022-08-24 DIAGNOSIS — F4322 Adjustment disorder with anxiety: Secondary | ICD-10-CM | POA: Diagnosis not present

## 2022-08-31 DIAGNOSIS — F4312 Post-traumatic stress disorder, chronic: Secondary | ICD-10-CM | POA: Diagnosis not present

## 2022-09-28 DIAGNOSIS — F4322 Adjustment disorder with anxiety: Secondary | ICD-10-CM | POA: Diagnosis not present

## 2022-10-12 DIAGNOSIS — F4322 Adjustment disorder with anxiety: Secondary | ICD-10-CM | POA: Diagnosis not present

## 2022-10-19 DIAGNOSIS — F4322 Adjustment disorder with anxiety: Secondary | ICD-10-CM | POA: Diagnosis not present

## 2022-10-26 DIAGNOSIS — F4322 Adjustment disorder with anxiety: Secondary | ICD-10-CM | POA: Diagnosis not present

## 2022-11-02 DIAGNOSIS — F4322 Adjustment disorder with anxiety: Secondary | ICD-10-CM | POA: Diagnosis not present

## 2022-11-09 DIAGNOSIS — F4322 Adjustment disorder with anxiety: Secondary | ICD-10-CM | POA: Diagnosis not present

## 2022-11-16 DIAGNOSIS — F4322 Adjustment disorder with anxiety: Secondary | ICD-10-CM | POA: Diagnosis not present

## 2022-11-23 DIAGNOSIS — F4322 Adjustment disorder with anxiety: Secondary | ICD-10-CM | POA: Diagnosis not present

## 2022-11-30 DIAGNOSIS — F4322 Adjustment disorder with anxiety: Secondary | ICD-10-CM | POA: Diagnosis not present

## 2022-12-17 DIAGNOSIS — F4322 Adjustment disorder with anxiety: Secondary | ICD-10-CM | POA: Diagnosis not present

## 2022-12-23 DIAGNOSIS — F4322 Adjustment disorder with anxiety: Secondary | ICD-10-CM | POA: Diagnosis not present

## 2022-12-31 DIAGNOSIS — F4322 Adjustment disorder with anxiety: Secondary | ICD-10-CM | POA: Diagnosis not present

## 2023-01-15 DIAGNOSIS — Z23 Encounter for immunization: Secondary | ICD-10-CM | POA: Diagnosis not present

## 2023-04-02 DIAGNOSIS — A689 Relapsing fever, unspecified: Secondary | ICD-10-CM | POA: Diagnosis not present

## 2023-04-02 DIAGNOSIS — R220 Localized swelling, mass and lump, head: Secondary | ICD-10-CM | POA: Diagnosis not present

## 2023-04-02 DIAGNOSIS — Z0000 Encounter for general adult medical examination without abnormal findings: Secondary | ICD-10-CM | POA: Diagnosis not present

## 2023-04-02 DIAGNOSIS — J45990 Exercise induced bronchospasm: Secondary | ICD-10-CM | POA: Diagnosis not present

## 2023-05-04 ENCOUNTER — Other Ambulatory Visit: Payer: Self-pay

## 2023-05-04 ENCOUNTER — Ambulatory Visit: Payer: BC Managed Care – PPO | Admitting: Internal Medicine

## 2023-05-04 ENCOUNTER — Encounter: Payer: Self-pay | Admitting: Internal Medicine

## 2023-05-04 VITALS — BP 122/79 | HR 85 | Temp 97.6°F | Resp 16 | Ht 65.0 in | Wt 171.0 lb

## 2023-05-04 DIAGNOSIS — R509 Fever, unspecified: Secondary | ICD-10-CM

## 2023-05-04 NOTE — Patient Instructions (Signed)
 At this time your fever is rather mostly isolated  I have very low suspicion this is any chronic infection such as tb, tick disease, or brucella/qfever (unpasteurized products -- pregnant livestock disease)  Other possibility would be some rheumatologic disease. Low suspicion for lymphoma but will check   Tests for inflammatoin/rheumatologic screen and body ct to look for lymph node swelling (rheum/cancer)   If negative would consider getting pet scan and will need a few more visits over the next few years to ascribe symptoms to syndromes and more workup as needed   Video visit with me in 4-6 weeks

## 2023-05-04 NOTE — Progress Notes (Signed)
 [facility]  Reason for Consult:recurrent fever Referring Provider: Lamarr Rotunda, MD [facility] physicians)    Patient Active Problem List   Diagnosis Date Noted   Drug reaction 03/07/2020   Adverse food reaction 03/07/2020   History of systemic reaction to bee sting 03/07/2020   Normal labor 12/27/2019   SVD (spontaneous vaginal delivery) 9/8 12/27/2019   Postpartum care following vaginal delivery 9/8 12/27/2019   Hyperemesis gravidarum before end of [redacted] week gestation with dehydration 06/02/2019      HPI: [name] is a 28 y.o. female g2p2 (last delivery March 28, 2020) referred for recurrent fever  I reviewed records available for his visit: 04/06/23 primary care visit: Visit for exercise induced asthma Last month feels ach and tired and temperature was 100 for several days. Associated myalgias. Improved with advil . Temperature duing visit 98; hr 73; bp 105/72 Labs sent: Tsh - 0.74 normal Cbc - 8.8/14/269; normal diff Cmp - cr 0.76; lft 16/18/56/0.4 Hcv antibody - nr Hiv screen - nr Rpr - nr Urine gc/chlam/trich - nr   Patient feels well now  She has been having fever for a long time. First time noticed 09/2018 first when covid pandemic started. 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 Had vaccinations/boosters; last time booster some time in early Mar 29, 2023 Highest has been 102.5 Most would be 100-101 (most recent 04/20/2023)  She used an ear and a mouth thermometer  When she has fever she would have headache and body ache, malaise, cough, dysuria, hematuria, n/v/diarrhea, focal numbness/tingling/weakness  No recurrent oral genital ulcer No physical description of raynauds  No recurrent chest/abd pain  Sometimes have mild acid reflux/heart burn  She describes some memory fog but unlear if due to having young kids   Good appetite Some weight loss but she is running a lot  Longest the fever lasted was  about a week and a half. These bouts of fever occur at random onsets. The fever occurred at different times of the day  She has exercise induced asthma which occurs when she run. No hx nasal polyps. 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  Prior travel: [place]  [place] March 28, 2021 [place] 2016/03/28 No tb endemic countries   Pets: Used to have a cat --passed away 03/28/2021 Doesn't live near farms Go to field trip to farms No fishing/hunting   Intake exposure: No unpasteurized dairy products No smoking drinking street drugs   Meds: Mvi Symbicort Albuterol No birth control including iud    Family hx: Paternal and maternal dementia not early onset Aunt breast cancer Distant aunt with lupus Dad heart issues; dm2 Grandfather stomach cancer No lymphoma   Review of Systems: ROS All other ros negative      Past Medical History:  Diagnosis Date   Allergy    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   Chest pain    Headache    Infection    UTI   Ovarian cyst    Palpitations  Urticaria     Social History   Tobacco Use   Smoking status: Never   Smokeless tobacco: Never  Vaping Use   Vaping status: Never Used  Substance Use Topics   Alcohol use: No   Drug use: No    Family History  Problem Relation Age of Onset   Diabetes Mother    Hypertension Mother    Alcohol abuse Mother    Appendicitis Brother    Drug abuse Brother    Urticaria Brother    Cancer Maternal Aunt    Asthma Maternal Aunt    Cancer Paternal Uncle    Diabetes  Maternal Grandmother    Diabetes Maternal Grandfather    Hypertension Maternal Grandfather    Cancer Paternal Grandmother        liver cancer   Asthma Paternal Grandmother     Allergies  Allergen Reactions   Bee Venom Anaphylaxis   Red Dye #40 (Allura Red) Anaphylaxis   Wasp Venom Anaphylaxis   Tamiflu [Oseltamivir] Hives   Dicloxacillin  Hives, Itching and Rash   Latex Rash    OBJECTIVE: Vitals:   05/04/23 1500  Weight: 171 lb (77.6 kg)   Body mass index is 27.6 kg/m.   Physical Exam General/constitutional: no distress, pleasant HEENT: Normocephalic, PER, Conj Clear, EOMI, Oropharynx clear Neck supple CV: rrr no mrg Lungs: clear to auscultation, normal respiratory effort Abd: Soft, Nontender Ext: no edema Skin: No Rash -- small montender mobile 1 inch forehead soft tissue papule Neuro: nonfocal MSK: no peripheral joint swelling/tenderness/warmth; back spines nontender Lymph: no cervical/axillary lymphadenopathy   Lab:  Microbiology:  Serology:  Imaging:   Assessment/plan: Problem List Items Addressed This Visit   None Visit Diagnoses       Fever, unspecified fever cause    -  Primary   Relevant Orders   Sedimentation rate   C-reactive protein   ANA, IFA Comprehensive Panel-([facility])   ANCA screen with reflex titer   Lactate dehydrogenase   Rheumatoid Factor   QuantiFERON-TB Gold Plus   CT CHEST ABDOMEN PELVIS W CONTRAST   Ferritin         Years of fever and minimal sx otherwise and rather bland exposure hx outside of working with homeless and some internaltional travel may suggest tb as possibility but hx / exam minimal  I have low suspision for recurrent tick disease (relapsing fever often body louse/ticks) but doesn't appear to be case  Exotic disease such as brucella/qfever no risk for   Leaning toward connective tissue process but also very minal sx. Family hx lupus but only known relative. Has asthma but hx not suggestive of wegner's  granulomatosis/churgstrauss  Lymphoma considered but also unlikely   Given duration will send battery of tests and get body ct   If all negative will consider pet scan and anticipate long term hx repeat evaluation    Video visit 4-6 weeks Journal of fever/sx/rash        Follow-up: Return in about 4 weeks (around 06/01/2023).  Constance ONEIDA Passer, MD [facility] 05/04/2023, 3:02 PM

## 2023-05-09 LAB — ANA, IFA COMPREHENSIVE PANEL
Anti Nuclear Antibody (ANA): NEGATIVE
ENA SM Ab Ser-aCnc: <1 AI
SM/RNP: <1 AI
SSA (Ro) (ENA) Antibody, IgG: <1 AI
SSB (La) (ENA) Antibody, IgG: <1 AI
Scleroderma (Scl-70) (ENA) Antibody, IgG: <1 AI
ds DNA Ab: 1 [IU]/mL

## 2023-05-09 LAB — QUANTIFERON-TB GOLD PLUS
Mitogen-NIL: 8.03 [IU]/mL
NIL: 0.03 [IU]/mL
QuantiFERON-TB Gold Plus: NEGATIVE
TB1-NIL: 0 [IU]/mL
TB2-NIL: 0.01 [IU]/mL

## 2023-05-09 LAB — C-REACTIVE PROTEIN: CRP: <3 mg/L (ref <8.0)

## 2023-05-09 LAB — ANCA SCREEN W REFLEX TITER: ANCA SCREEN: NEGATIVE

## 2023-05-09 LAB — FERRITIN: Ferritin: 26 ng/mL (ref 16–154)

## 2023-05-09 LAB — RHEUMATOID FACTOR: Rheumatoid fact SerPl-aCnc: <10 [IU]/mL (ref <14)

## 2023-05-09 LAB — SEDIMENTATION RATE: Sed Rate: 2 mm/h (ref 0–20)

## 2023-05-09 LAB — LACTATE DEHYDROGENASE: LDH: 137 U/L (ref 100–200)

## 2023-05-11 ENCOUNTER — Encounter: Payer: Self-pay | Admitting: Internal Medicine

## 2023-05-13 ENCOUNTER — Ambulatory Visit (HOSPITAL_COMMUNITY)
Admission: RE | Admit: 2023-05-13 | Discharge: 2023-05-13 | Disposition: A | Discharge status: Home / Self Care | Payer: BC Managed Care – PPO | Source: Ambulatory Visit | Attending: Internal Medicine | Admitting: Internal Medicine

## 2023-05-13 DIAGNOSIS — R509 Fever, unspecified: Secondary | ICD-10-CM | POA: Insufficient documentation

## 2023-05-13 MED ORDER — IOHEXOL 300 MG/ML  SOLN
100.0000 mL | Freq: Once | INTRAMUSCULAR | Status: AC | PRN
  Administered 2023-05-13: 100 mL via INTRAVENOUS

## 2023-05-18 ENCOUNTER — Encounter: Payer: Self-pay | Admitting: Internal Medicine

## 2023-06-03 ENCOUNTER — Other Ambulatory Visit: Payer: Self-pay

## 2023-06-03 ENCOUNTER — Telehealth (INDEPENDENT_AMBULATORY_CARE_PROVIDER_SITE_OTHER): Payer: BC Managed Care – PPO | Admitting: Internal Medicine

## 2023-06-03 DIAGNOSIS — R509 Fever, unspecified: Secondary | ICD-10-CM

## 2023-06-03 NOTE — Progress Notes (Signed)
[facility]  Reason for Consult:recurrent fever Referring Provider: Garth Bigness, MD [facility] physicians)    Patient Active Problem List   Diagnosis Date Noted   Drug reaction 03/07/2020   Adverse food reaction 03/07/2020   History of systemic reaction to bee sting 03/07/2020   Normal labor 12/27/2019   SVD (spontaneous vaginal delivery) 9/8 12/27/2019   Postpartum care following vaginal delivery 9/8 12/27/2019   Hyperemesis gravidarum before end of [redacted] week gestation with dehydration 06/02/2019      HPI: [name] is a 28 y.o. female g2p2 (last delivery 06/21/2019) referred for recurrent fever  I reviewed records available for his visit: 04/06/23 primary care visit: Visit for exercise induced asthma "Last month feels ach and tired and temperature was 100 for several days. Associated myalgias. Improved with advil." Temperature duing visit 98; hr 73; bp 105/72 Labs sent: Tsh - 0.74 normal Cbc - 8.8/14/269; normal diff Cmp - cr 0.76; lft 16/18/56/0.4 Hcv antibody - nr Hiv screen - nr Rpr - nr Urine gc/chlam/trich - nr   Patient feels well now  She has been having fever for a long time. First time noticed 09/2018 first when covid pandemic started. 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 Had vaccinations/boosters; last time booster some time in early Jun 20, 2022 Highest has been 102.5 Most would be 100-101 (most recent 04/20/2023)  She used an ear and a mouth thermometer  When she has fever she would have headache and body ache, malaise, cough, dysuria, hematuria, n/v/diarrhea, focal numbness/tingling/weakness  No recurrent oral genital ulcer No physical description of raynauds  No recurrent chest/abd pain  Sometimes have mild acid reflux/heart burn  She describes some memory fog but unlear if due to having young kids   Good appetite Some weight loss but she is running a lot  Longest the fever lasted was  about a week and a half. These bouts of fever occur at random onsets. The fever occurred at different times of the day  She has exercise induced asthma which occurs when she run. No hx nasal polyps. 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  Prior travel: [place] 2020/06/20 [place] June 21, 2015 No tb endemic countries   Pets: Used to have a cat --passed away 06/20/20 Doesn't live near farms Go to field trip to farms No fishing/hunting   Intake exposure: No unpasteurized dairy products No smoking drinking street drugs   Meds: Mvi Symbicort Albuterol No birth control including iud    Family hx: Paternal and maternal dementia not early onset Aunt breast cancer Distant aunt with lupus Dad heart issues; dm2 Grandfather stomach cancer No lymphoma   ----------------- 06/03/23 id clinic f/u Patient currently have a cold so she does have fever above 101 Previous to this cold she thinks she has highs of 100.5 Again not much else in terms of symptomatology She does relate that workup is costly for her and time consuming with her kids/family to take care of  Reviewed labs/pan-body ct all normal    Review of Systems: ROS All other ros negative      Past Medical History:  Diagnosis Date   Allergy  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   Chest pain    Headache    Infection    UTI   Ovarian cyst    Palpitations    Urticaria     Social History   Tobacco Use   Smoking status: Never   Smokeless tobacco: Never  Vaping Use   Vaping  status: Never Used  Substance Use Topics   Alcohol use: No   Drug use: No    Family History  Problem Relation Age of Onset   Diabetes Mother    Hypertension Mother    Alcohol abuse Mother    Appendicitis Brother    Drug abuse Brother    Urticaria Brother    Cancer Maternal Aunt    Asthma Maternal Aunt    Cancer Paternal Uncle    Diabetes Maternal Grandmother    Diabetes Maternal Grandfather    Hypertension Maternal Grandfather    Cancer Paternal Grandmother        liver cancer   Asthma Paternal Grandmother     Allergies  Allergen Reactions   Bee Venom Anaphylaxis   Red Dye #40 (Allura Red) Anaphylaxis   Wasp Venom Anaphylaxis   Tamiflu [Oseltamivir] Hives   Dicloxacillin Hives, Itching and Rash   Latex Rash    OBJECTIVE: There were no vitals filed for this visit.  There is no height or weight on file to calculate BMI.   Physical Exam General/constitutional: no distress, pleasant Pulm: normal respiratory effort    Lab: Lab Results  Component Value Date   WBC 10.7 (H) 12/28/2019   HGB 10.9 (L) 12/28/2019   HCT 32.4 (L) 12/28/2019   MCV 89.0 12/28/2019   PLT 195 12/28/2019   Last metabolic panel Lab Results  Component Value Date   GLUCOSE 74 12/10/2016   NA 136 12/10/2016   K 3.7 12/10/2016   CL 105 12/10/2016   CO2 24 12/10/2016   BUN 13 12/10/2016   CREATININE 0.44 12/10/2016   GFRNONAA >60 12/10/2016   CALCIUM 8.7 (L) 12/10/2016   PROT 6.8 09/07/2016   ALBUMIN 4.1 09/07/2016   BILITOT 0.5 09/07/2016   ALKPHOS 41 09/07/2016   AST 16 09/07/2016   ALT 14 09/07/2016   ANIONGAP 7 12/10/2016   Lab Results  Component Value Date   CRP <3.0 05/04/2023   Lab Results  Component Value Date   ESRSEDRATE 2 05/04/2023   Lab Results  Component Value Date   ANA NEGATIVE 05/04/2023   Quantiferon gold negative  Microbiology:  Serology:  Imaging: Reviewed  05/13/23 ct abd pelv chest No abnormality   Assessment/plan: Problem List Items  Addressed This Visit   None Visit Diagnoses       FUO (fever of unknown origin)    -  Primary          Years of fever and minimal sx otherwise and rather "bland" exposure hx outside of working with homeless and some internaltional travel may suggest tb as possibility but hx / exam minimal  I have low suspision for recurrent tick disease (relapsing fever often body louse/ticks) but doesn't appear to be case  Exotic disease such as brucella/qfever no risk for   Leaning toward connective tissue process but also very minal sx. Family hx lupus but only known relative. Has asthma but hx not suggestive of wegner's  granulomatosis/churgstrauss  Lymphoma considered but also unlikely   Given duration will send battery of tests and get body ct   If all negative will consider pet scan and anticipate long term hx repeat evaluation    Video visit 4-6 weeks Journal of fever/sx/rash   ------------- 06/03/23 id assessment FUO Currently have cold symptoms She expresses frustration at inability to get to dx and expresses worry about more financial burden/time burden to do further testing I discuss with her while it doesn't appear infectious cause is the player, if this is rheumatologic it would have implication in terms of end organ chronic damage or cardiovascular complication   I advise her to keep journal of sx and especially if any sign of the spectrum of lupus sx (we discussed) occurs she should seek help  Familial fever syndrome considered and if nothing else outside of fever, consider pet and immunology referral otherwise as well  It is reasonable now to wait on pet at her request  F/u as needed     I connected with  Jaynie Bream on 06/03/2023  I verified that I was speaking with the correct person using two identifiers. Due to the nature of visit/patient's request, this service was provided via telemedicine using audio/visual media.   The patient was located at  home. The provider was located in the office. The patient did consent to this visit and is aware of charges through their insurance as well as the limitations of evaluation and management by telemedicine. Other persons participating in this telemedicine service were none. Time spent on visit was greater than 20 minutes on media and in coordination of care      Follow-up: No follow-ups on file.  Raymondo Band, MD [facility] 06/03/2023, 1:39 PM

## 2023-06-17 DIAGNOSIS — R300 Dysuria: Secondary | ICD-10-CM | POA: Diagnosis not present

## 2023-06-24 DIAGNOSIS — R52 Pain, unspecified: Secondary | ICD-10-CM | POA: Diagnosis not present

## 2023-06-24 DIAGNOSIS — N841 Polyp of cervix uteri: Secondary | ICD-10-CM | POA: Diagnosis not present

## 2023-07-01 DIAGNOSIS — N841 Polyp of cervix uteri: Secondary | ICD-10-CM | POA: Diagnosis not present

## 2023-07-06 DIAGNOSIS — R102 Pelvic and perineal pain: Secondary | ICD-10-CM | POA: Diagnosis not present

## 2023-09-15 DIAGNOSIS — F411 Generalized anxiety disorder: Secondary | ICD-10-CM | POA: Diagnosis not present

## 2023-09-17 DIAGNOSIS — Z3202 Encounter for pregnancy test, result negative: Secondary | ICD-10-CM | POA: Diagnosis not present

## 2023-09-17 DIAGNOSIS — R102 Pelvic and perineal pain: Secondary | ICD-10-CM | POA: Diagnosis not present

## 2023-09-28 DIAGNOSIS — F411 Generalized anxiety disorder: Secondary | ICD-10-CM | POA: Diagnosis not present

## 2023-09-29 DIAGNOSIS — F411 Generalized anxiety disorder: Secondary | ICD-10-CM | POA: Diagnosis not present

## 2023-10-06 DIAGNOSIS — F411 Generalized anxiety disorder: Secondary | ICD-10-CM | POA: Diagnosis not present

## 2023-10-07 ENCOUNTER — Ambulatory Visit

## 2023-10-20 DIAGNOSIS — F411 Generalized anxiety disorder: Secondary | ICD-10-CM | POA: Diagnosis not present

## 2023-10-28 ENCOUNTER — Encounter: Payer: Self-pay | Admitting: Physical Therapy

## 2023-10-28 ENCOUNTER — Ambulatory Visit: Attending: Obstetrics | Admitting: Physical Therapy

## 2023-10-28 ENCOUNTER — Other Ambulatory Visit: Payer: Self-pay

## 2023-10-28 DIAGNOSIS — M62838 Other muscle spasm: Secondary | ICD-10-CM | POA: Diagnosis not present

## 2023-10-28 DIAGNOSIS — M6281 Muscle weakness (generalized): Secondary | ICD-10-CM | POA: Insufficient documentation

## 2023-10-28 DIAGNOSIS — R293 Abnormal posture: Secondary | ICD-10-CM | POA: Insufficient documentation

## 2023-10-28 DIAGNOSIS — R279 Unspecified lack of coordination: Secondary | ICD-10-CM | POA: Insufficient documentation
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
## 2023-11-04 ENCOUNTER — Ambulatory Visit: Admitting: Physical Therapy

## 2023-11-04 DIAGNOSIS — R279 Unspecified lack of coordination: Secondary | ICD-10-CM

## 2023-11-04 DIAGNOSIS — M6281 Muscle weakness (generalized): Secondary | ICD-10-CM

## 2023-11-04 DIAGNOSIS — R293 Abnormal posture: Secondary | ICD-10-CM | POA: Diagnosis not present

## 2023-11-04 DIAGNOSIS — M62838 Other muscle spasm: Secondary | ICD-10-CM | POA: Diagnosis not present
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
## 2023-11-11 ENCOUNTER — Ambulatory Visit: Payer: Self-pay | Admitting: Physical Therapy

## 2023-12-02 ENCOUNTER — Ambulatory Visit: Attending: Obstetrics | Admitting: Physical Therapy

## 2023-12-02 DIAGNOSIS — R293 Abnormal posture: Secondary | ICD-10-CM | POA: Diagnosis not present

## 2023-12-02 DIAGNOSIS — R279 Unspecified lack of coordination: Secondary | ICD-10-CM | POA: Insufficient documentation

## 2023-12-02 DIAGNOSIS — M62838 Other muscle spasm: Secondary | ICD-10-CM | POA: Insufficient documentation

## 2023-12-02 DIAGNOSIS — M6281 Muscle weakness (generalized): Secondary | ICD-10-CM | POA: Diagnosis not present
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
## 2023-12-02 NOTE — Patient Instructions (Addendum)
[email or web address]   

## 2023-12-07 ENCOUNTER — Ambulatory Visit: Admitting: Physical Therapy

## 2023-12-07 ENCOUNTER — Encounter: Payer: Self-pay | Admitting: Physical Therapy

## 2023-12-07 DIAGNOSIS — R279 Unspecified lack of coordination: Secondary | ICD-10-CM | POA: Diagnosis not present

## 2023-12-07 DIAGNOSIS — M62838 Other muscle spasm: Secondary | ICD-10-CM

## 2023-12-07 DIAGNOSIS — M6281 Muscle weakness (generalized): Secondary | ICD-10-CM

## 2023-12-07 DIAGNOSIS — R293 Abnormal posture: Secondary | ICD-10-CM
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
## 2023-12-14 ENCOUNTER — Ambulatory Visit: Admitting: Physical Therapy

## 2023-12-14 DIAGNOSIS — M6281 Muscle weakness (generalized): Secondary | ICD-10-CM | POA: Diagnosis not present

## 2023-12-14 DIAGNOSIS — R293 Abnormal posture: Secondary | ICD-10-CM | POA: Diagnosis not present

## 2023-12-14 DIAGNOSIS — M62838 Other muscle spasm: Secondary | ICD-10-CM

## 2023-12-14 DIAGNOSIS — R279 Unspecified lack of coordination: Secondary | ICD-10-CM
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
## 2023-12-21 ENCOUNTER — Ambulatory Visit: Admitting: Physical Therapy

## 2023-12-28 ENCOUNTER — Ambulatory Visit: Attending: Obstetrics | Admitting: Physical Therapy

## 2023-12-28 DIAGNOSIS — M62838 Other muscle spasm: Secondary | ICD-10-CM | POA: Diagnosis not present

## 2023-12-28 DIAGNOSIS — M6281 Muscle weakness (generalized): Secondary | ICD-10-CM | POA: Diagnosis not present

## 2023-12-28 DIAGNOSIS — R293 Abnormal posture: Secondary | ICD-10-CM | POA: Diagnosis not present
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
## 2024-01-04 ENCOUNTER — Ambulatory Visit: Admitting: Physical Therapy

## 2024-01-10 DIAGNOSIS — F411 Generalized anxiety disorder: Secondary | ICD-10-CM | POA: Diagnosis not present

## 2024-01-11 ENCOUNTER — Ambulatory Visit: Admitting: Physical Therapy

## 2024-01-11 DIAGNOSIS — R293 Abnormal posture: Secondary | ICD-10-CM | POA: Diagnosis not present

## 2024-01-11 DIAGNOSIS — M62838 Other muscle spasm: Secondary | ICD-10-CM

## 2024-01-11 DIAGNOSIS — M6281 Muscle weakness (generalized): Secondary | ICD-10-CM | POA: Diagnosis not present
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
## 2024-01-18 ENCOUNTER — Encounter: Admitting: Physical Therapy

## 2024-02-08 ENCOUNTER — Telehealth: Payer: Self-pay | Admitting: Physical Therapy

## 2024-02-08 ENCOUNTER — Ambulatory Visit: Admitting: Physical Therapy

## 2024-02-08 NOTE — Telephone Encounter (Signed)
 PT called pt about this morning's appointment at [phone]. Pt did not answer, unable to leave voicemail  Darryle Navy, PT, DPT 10/21/259:05 AM  [facility] [address], [address] [place], [place] [address] Phone # [phone] Fax [phone]

## 2024-02-15 DIAGNOSIS — F411 Generalized anxiety disorder: Secondary | ICD-10-CM | POA: Diagnosis not present

## 2024-02-29 DIAGNOSIS — F411 Generalized anxiety disorder: Secondary | ICD-10-CM | POA: Diagnosis not present

## 2024-03-24 DIAGNOSIS — S61012A Laceration without foreign body of left thumb without damage to nail, initial encounter: Secondary | ICD-10-CM | POA: Diagnosis not present

## 2024-03-24 DIAGNOSIS — W260XXA Contact with knife, initial encounter: Secondary | ICD-10-CM | POA: Diagnosis not present

## 2024-04-03 DIAGNOSIS — A689 Relapsing fever, unspecified: Secondary | ICD-10-CM | POA: Diagnosis not present

## 2024-04-03 DIAGNOSIS — J45990 Exercise induced bronchospasm: Secondary | ICD-10-CM | POA: Diagnosis not present

## 2024-04-03 DIAGNOSIS — Z23 Encounter for immunization: Secondary | ICD-10-CM | POA: Diagnosis not present

## 2024-04-03 DIAGNOSIS — Z0000 Encounter for general adult medical examination without abnormal findings: Secondary | ICD-10-CM | POA: Diagnosis not present

## 2024-04-05 DIAGNOSIS — A689 Relapsing fever, unspecified: Secondary | ICD-10-CM | POA: Diagnosis not present
